# Patient Record
Sex: Male | Born: 1965 | Race: White | Hispanic: No | Marital: Single | Smoking: Former smoker
Health system: Southern US, Community
[De-identification: ages and names within clinical notes are randomized; demographics above are authoritative.]

## PROBLEM LIST (undated history)

## (undated) DIAGNOSIS — K5792 Diverticulitis of intestine, part unspecified, without perforation or abscess without bleeding: Secondary | ICD-10-CM

## (undated) DIAGNOSIS — F419 Anxiety disorder, unspecified: Secondary | ICD-10-CM

## (undated) DIAGNOSIS — I10 Essential (primary) hypertension: Secondary | ICD-10-CM

## (undated) DIAGNOSIS — Z905 Acquired absence of kidney: Secondary | ICD-10-CM

## (undated) HISTORY — PX: OTHER SURGICAL HISTORY: SHX169

## (undated) HISTORY — PX: APPENDECTOMY: SHX54

---

## 2002-08-31 ENCOUNTER — Encounter: Payer: Self-pay | Admitting: Family Medicine

## 2002-08-31 ENCOUNTER — Ambulatory Visit (HOSPITAL_COMMUNITY): Admission: RE | Admit: 2002-08-31 | Discharge: 2002-08-31 | Payer: Self-pay | Admitting: Family Medicine

## 2004-08-19 ENCOUNTER — Inpatient Hospital Stay (HOSPITAL_COMMUNITY): Admission: EM | Admit: 2004-08-19 | Discharge: 2004-08-21 | Payer: Self-pay | Admitting: Emergency Medicine

## 2004-08-19 ENCOUNTER — Ambulatory Visit: Payer: Self-pay | Admitting: Internal Medicine

## 2004-08-21 ENCOUNTER — Inpatient Hospital Stay (HOSPITAL_COMMUNITY): Admission: RE | Admit: 2004-08-21 | Discharge: 2004-08-23 | Payer: Self-pay | Admitting: Psychiatry

## 2005-08-04 ENCOUNTER — Emergency Department (HOSPITAL_COMMUNITY): Admission: EM | Admit: 2005-08-04 | Discharge: 2005-08-04 | Payer: Self-pay | Admitting: Emergency Medicine

## 2006-01-23 ENCOUNTER — Emergency Department (HOSPITAL_COMMUNITY): Admission: EM | Admit: 2006-01-23 | Discharge: 2006-01-23 | Payer: Self-pay | Admitting: Emergency Medicine

## 2006-04-11 ENCOUNTER — Emergency Department (HOSPITAL_COMMUNITY): Admission: EM | Admit: 2006-04-11 | Discharge: 2006-04-12 | Payer: Self-pay | Admitting: Emergency Medicine

## 2006-04-14 ENCOUNTER — Inpatient Hospital Stay (HOSPITAL_COMMUNITY): Admission: AD | Admit: 2006-04-14 | Discharge: 2006-04-21 | Payer: Self-pay | Admitting: Family Medicine

## 2006-04-14 ENCOUNTER — Ambulatory Visit (HOSPITAL_COMMUNITY): Admission: RE | Admit: 2006-04-14 | Discharge: 2006-04-14 | Payer: Self-pay | Admitting: Family Medicine

## 2006-06-01 ENCOUNTER — Ambulatory Visit (HOSPITAL_COMMUNITY): Admission: RE | Admit: 2006-06-01 | Discharge: 2006-06-01 | Payer: Self-pay | Admitting: General Surgery

## 2006-06-08 ENCOUNTER — Ambulatory Visit: Payer: Self-pay | Admitting: Internal Medicine

## 2006-06-08 ENCOUNTER — Ambulatory Visit (HOSPITAL_COMMUNITY): Admission: RE | Admit: 2006-06-08 | Discharge: 2006-06-08 | Payer: Self-pay | Admitting: Internal Medicine

## 2010-02-16 ENCOUNTER — Encounter: Payer: Self-pay | Admitting: Internal Medicine

## 2010-06-11 NOTE — Op Note (Signed)
[name], [name]             ACCOUNT NO.:  [id number]   MEDICAL RECORD NO.:  [id number]          PATIENT TYPE:  AMB   LOCATION:  DAY                           FACILITY:  [facility]   PHYSICIAN:  Lionel December, M.D.    DATE OF BIRTH:  1965/12/08   DATE OF PROCEDURE:  06/08/2006  DATE OF DISCHARGE:                               OPERATIVE REPORT   PROCEDURE:  Attempted colonoscopy, limited to flexible sigmoidoscopy.   INDICATIONS FOR PROCEDURE:  Kenwood is a 45 year old Caucasian male who  was recently hospitalized for diverticulitis and treated with IV  antibiotics.  He was discharged on Cipro and Flagyl but could not  tolerate Flagyl.  He may have taken another antibiotic in addition to  Cipro.  He was seen by Dr. Malvin Johns in follow-up and still having some  discomfort with his bowel movement, although he is not having any fever  or chills.  CT was done and showed persistent thickening in the sigmoid  colon with some air outside suggesting fistula and also raising the  possibility of a neoplastic process.  Dr. Malvin Johns therefore requested  diagnostic colonoscopy.  He did not have any problem with the prep.  Procedure risks were reviewed with the patient and informed consent was  obtained.   MEDICATIONS FOR CONSCIOUS SEDATION:  Demerol 50 mg IV, Versed 11 mg IV  in divided dose.   FINDINGS:  Procedure performed in endoscopy suite.  The patient's vital  signs and O2 sat were monitored during the procedure and remained  stable.  The patient was placed in the left lateral recumbent position  and rectal examination performed.  No abnormality was noted on external  or digital exam.  Pentax videoscope was placed in the rectum and  advanced into distal sigmoid colon where there was mucosal edema,  exudate and erythema.  There was luminal compromise.  I could not find  the lumen because it was right at the bends.  Felt these findings were  consistent with diverticulitis and therefore  endoscope was withdrawn.  Rectal mucosa was normal.  Scope was retroflexed to examine anorectal  junction which was unremarkable except thickening to mucosa of the anal  canal.  Scope was straightened and withdrawn.  The patient tolerated the  procedure well.   FINAL DIAGNOSIS:  Mucosal edema, erythema and exudate with a luminal  compromise in the distal sigmoid colon.  These findings are suspicious  for recurrent or partially resolved diverticulitis.  Colonoscopy,  therefore was not performed.   RECOMMENDATIONS:  1. Low-residue diet.  2. Doxycycline 100 mg p.o. b.i.d. prescription given for 60.  3. He was also given prescription for Vicodin 5/500 one t.i.d. p.r.n.      20 without refill.  4. Should he experience worsening pain, fever, nausea, vomiting,  he      should come to emergency room or give Dr. Malvin Johns or myself a      call.   Goal would be to try to control the disease activity so that he could  have one stage procedure should he need surgical intervention.      [employer]  Karilyn Cota, M.D.  Electronically Signed     NR/MEDQ  D:  06/08/2006  T:  06/08/2006  Job:  [id number]   cc:   Barbaraann Barthel, M.D.  Fax: [phone]   Dr. Phillips Odor
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
## 2010-06-14 NOTE — Discharge Summary (Signed)
NAMEJAKWAN, SALLY             ACCOUNT NO.:  [id number]   MEDICAL RECORD NO.:  [id number]          PATIENT TYPE:  INP   LOCATION:  A331                          FACILITY:  [facility]   PHYSICIAN:  Kirk Ruths, M.D.DATE OF BIRTH:  1965/02/14   DATE OF ADMISSION:  04/14/2006  DATE OF DISCHARGE:  03/25/2008LH                               DISCHARGE SUMMARY   DISCHARGE DIAGNOSES:  1. Acute diverticulitis.  2. Phlegmon in the pelvis.  3. Benign stable nodules in the chest.   HOSPITAL COURSE:  This 44 year old male had been seen in the Emergency  Room several days before admission.  At that time he was diagnosed with  diverticulitis and treated with oral antibiotics.  The patient was seen  in my office with worsening pain, tenderness.  He was admitted for acute  diverticulitis, IV Cipro and Flagyl.  CT scan of the abdomen and pelvis  showed acute inflammation consistent with diverticulitis as well as some  pelvic mass and some pulmonary nodules.  The patient was seen in  consultation by Dr. 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  Recommended no  further work-up or treatment.  The patient was also seen by surgery, Dr.  Malvin Johns who followed the patient closely in the event he should need  acute surgery.  The patient was in considerable pain and febrile for the  first several days.  He actually had a narcosis from Dilaudid but  responded promptly to Narcan.  He was transferred to intensive care at  that time for closer observation and his narcotics were reduced.  Initial white count was 15,000.  Remained elevated throughout his stay.  It was to 16.7 on discharge.  Initial sed rate was 18.  Repeat on this  stay was up to 30.  The patient's liver function tests were normal.  Thyroid studies were normal.  Urinalysis was normal.  The patient began  feeling better by the third or fourth hospital day.  Clear liquids were  advanced.  He was tolerating a regular diet.  He underwent a repeat CT  which showed a possible 1 cm tiny abscess in the phlegmon.  Dr. Malvin Johns  of surgery reviewed this with the radiologist and felt the patient was  defervescing and he should respond to IV antibiotics.  He was discharged  home on Cipro and Flagyl for another week.  He will be followed by Dr.  Malvin Johns for three weeks for follow-up CT.      Kirk Ruths, M.D.  Electronically Signed     WMM/MEDQ  D:  05/20/2006  T:  05/20/2006  Job:  [id number]
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
## 2010-06-14 NOTE — Consult Note (Signed)
[name], [name]             ACCOUNT NO.:  [id number]   MEDICAL RECORD NO.:  [id number]          PATIENT TYPE:  INP   LOCATION:  A311                          FACILITY:  [facility]   PHYSICIAN:  Edward L. Juanetta Gosling, M.D.DATE OF BIRTH:  04-28-1965   DATE OF CONSULTATION:  04/15/2006  DATE OF DISCHARGE:                                 CONSULTATION   REASON FOR CONSULTATION:  Abnormal chest CT   HISTORY:  [name] is a 45 year old who came in with abdominal pain.  He has had cramping abdominal pain in the periumbilical area. He has had  some nausea, no diarrhea, no vomiting.  He was diagnosed with acute  diverticulitis and brought into the hospital.  As part of his  evaluation, he had abdominal CT which shows that he has left subpleural  and left posterolateral chest wall masses that do enhance.  These are  unchanged since 2006, so it is very unlikely that they represent  anything other than a benign etiology.  However, he does say that he has  some shortness of breath occasionally.  He coughs and sometimes coughs  up something. He does smoke about a pack and one-half of cigarettes a  day.  He has a history of allergies and asthma, and he does have a  family history of lung problem in his grandfather.  He does not have any  hypertension, diabetes or any other medical problems.   His only medication is Lexapro.   REVIEW OF SYSTEMS:  Other than as mentioned, is pretty much negative.  He says he is getting better from his abdominal discomfort.   PHYSICAL EXAMINATION:  GENERAL:  He is awake and alert.  VITAL SIGNS: His temperature is 97.4, pulse 89, respirations 20, blood  pressure 114/60, O2 saturation 94% on room air.  HEENT: His pupils are reactive.  His nose and throat are clear.  NECK:  Supple without masses.  CHEST: Clear without wheezes, rales or rhonchi.  HEART: Regular without murmur, gallop or rub.  ABDOMEN:  Fairly soft.   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  I do not think  he needs any more evaluation.  This has been stable for almost 2 years.  Thanks for allowing me to see him with you.   Sincerely,      Edward L. Juanetta Gosling, M.D.  Electronically Signed     ELH/MEDQ  D:  04/15/2006  T:  04/15/2006  Job:  [id number]

## 2017-01-27 HISTORY — PX: COLONOSCOPY: SHX174

## 2020-02-17 ENCOUNTER — Emergency Department (HOSPITAL_COMMUNITY): Payer: Worker's Compensation

## 2020-02-17 ENCOUNTER — Encounter (HOSPITAL_COMMUNITY): Payer: Self-pay | Admitting: Emergency Medicine

## 2020-02-17 ENCOUNTER — Emergency Department (HOSPITAL_COMMUNITY)
Admission: EM | Admit: 2020-02-17 | Discharge: 2020-02-18 | Disposition: A | Discharge status: Home / Self Care | Payer: Worker's Compensation | Attending: Emergency Medicine | Admitting: Emergency Medicine

## 2020-02-17 ENCOUNTER — Other Ambulatory Visit: Payer: Self-pay

## 2020-02-17 DIAGNOSIS — S27321A Contusion of lung, unilateral, initial encounter: Secondary | ICD-10-CM | POA: Diagnosis not present

## 2020-02-17 DIAGNOSIS — S299XXA Unspecified injury of thorax, initial encounter: Secondary | ICD-10-CM | POA: Diagnosis present

## 2020-02-17 DIAGNOSIS — S2232XA Fracture of one rib, left side, initial encounter for closed fracture: Secondary | ICD-10-CM | POA: Insufficient documentation

## 2020-02-17 DIAGNOSIS — S301XXA Contusion of abdominal wall, initial encounter: Secondary | ICD-10-CM | POA: Insufficient documentation

## 2020-02-17 DIAGNOSIS — W000XXA Fall on same level due to ice and snow, initial encounter: Secondary | ICD-10-CM | POA: Diagnosis not present

## 2020-02-17 DIAGNOSIS — S32009A Unspecified fracture of unspecified lumbar vertebra, initial encounter for closed fracture: Secondary | ICD-10-CM | POA: Diagnosis not present

## 2020-02-17 DIAGNOSIS — F17210 Nicotine dependence, cigarettes, uncomplicated: Secondary | ICD-10-CM | POA: Diagnosis not present

## 2020-02-17 HISTORY — DX: Acquired absence of kidney: Z90.5

## 2020-02-17 HISTORY — DX: Diverticulitis of intestine, part unspecified, without perforation or abscess without bleeding: K57.92

## 2020-02-17 LAB — CBC WITH DIFFERENTIAL/PLATELET
Abs Immature Granulocytes: 0.06 10*3/uL (ref 0.00–0.07)
Basophils Absolute: 0.1 10*3/uL (ref 0.0–0.1)
Basophils Relative: 1 %
Eosinophils Absolute: 0.1 10*3/uL (ref 0.0–0.5)
Eosinophils Relative: 0 %
HCT: 46.9 % (ref 39.0–52.0)
Hemoglobin: 16.1 g/dL (ref 13.0–17.0)
Immature Granulocytes: 0 %
Lymphocytes Relative: 16 %
Lymphs Abs: 2.6 10*3/uL (ref 0.7–4.0)
MCH: 34.5 pg — ABNORMAL HIGH (ref 26.0–34.0)
MCHC: 34.3 g/dL (ref 30.0–36.0)
MCV: 100.4 fL — ABNORMAL HIGH (ref 80.0–100.0)
Monocytes Absolute: 1.3 10*3/uL — ABNORMAL HIGH (ref 0.1–1.0)
Monocytes Relative: 8 %
Neutro Abs: 12.3 10*3/uL — ABNORMAL HIGH (ref 1.7–7.7)
Neutrophils Relative %: 75 %
Platelets: 226 10*3/uL (ref 150–400)
RBC: 4.67 MIL/uL (ref 4.22–5.81)
RDW: 13.1 % (ref 11.5–15.5)
WBC: 16.4 10*3/uL — ABNORMAL HIGH (ref 4.0–10.5)
nRBC: 0 % (ref 0.0–0.2)

## 2020-02-17 LAB — BASIC METABOLIC PANEL
Anion gap: 6 (ref 5–15)
BUN: 12 mg/dL (ref 6–20)
CO2: 27 mmol/L (ref 22–32)
Calcium: 9 mg/dL (ref 8.9–10.3)
Chloride: 102 mmol/L (ref 98–111)
Creatinine, Ser: 1.01 mg/dL (ref 0.61–1.24)
GFR, Estimated: >60 mL/min (ref >60)
Glucose, Bld: 108 mg/dL — ABNORMAL HIGH (ref 70–99)
Potassium: 3.7 mmol/L (ref 3.5–5.1)
Sodium: 135 mmol/L (ref 135–145)

## 2020-02-17 MED ORDER — ONDANSETRON HCL 4 MG/2ML IJ SOLN
4.0000 mg | Freq: Once | INTRAMUSCULAR | Status: AC
  Administered 2020-02-17: 4 mg via INTRAVENOUS
  Filled 2020-02-17: qty 2

## 2020-02-17 MED ORDER — HYDROMORPHONE HCL 1 MG/ML IJ SOLN
1.0000 mg | Freq: Once | INTRAMUSCULAR | Status: AC
  Administered 2020-02-18: 1 mg via INTRAVENOUS
  Filled 2020-02-17: qty 1

## 2020-02-17 MED ORDER — LIDOCAINE 5 % EX PTCH
1.0000 | MEDICATED_PATCH | CUTANEOUS | Status: DC
  Administered 2020-02-18: 1 via TRANSDERMAL
  Filled 2020-02-17: qty 1

## 2020-02-17 MED ORDER — IOHEXOL 300 MG/ML  SOLN
75.0000 mL | Freq: Once | INTRAMUSCULAR | Status: AC | PRN
  Administered 2020-02-17: 75 mL via INTRAVENOUS

## 2020-02-17 MED ORDER — HYDROMORPHONE HCL 1 MG/ML IJ SOLN
1.0000 mg | Freq: Once | INTRAMUSCULAR | Status: AC
Start: 2020-02-17 — End: 2020-02-17
  Administered 2020-02-17: 1 mg via INTRAVENOUS
  Filled 2020-02-17: qty 1

## 2020-02-17 MED ORDER — KETOROLAC TROMETHAMINE 15 MG/ML IJ SOLN
15.0000 mg | Freq: Once | INTRAMUSCULAR | Status: AC
  Administered 2020-02-18: 15 mg via INTRAVENOUS
  Filled 2020-02-17: qty 1

## 2020-02-17 MED ORDER — OXYCODONE-ACETAMINOPHEN 5-325 MG PO TABS
2.0000 | ORAL_TABLET | Freq: Once | ORAL | Status: AC
  Administered 2020-02-17: 2 via ORAL
  Filled 2020-02-17: qty 2

## 2020-02-17 NOTE — ED Triage Notes (Signed)
Pt c/o back pain after a fall when he slipped on some ice and his mid back hit on the outside corner of the steps

## 2020-02-17 NOTE — ED Notes (Signed)
Patient transported to X-ray 

## 2020-02-17 NOTE — ED Provider Notes (Signed)
[facility] EMERGENCY DEPARTMENT Provider Note   CSN: [id number] Arrival date & time: 02/17/20  1837     History Chief Complaint  Patient presents with  . Back Pain    [name] is a 55 y.o. male who presents with cc of back injury.  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  He slipped backward and hit his left back against the corner of concrete stairs.  He had immediate severe pain, shortness of breath and felt something pop in his lower back.  He has been unable to control his pain since that time despite taking over-the-counter medications.  He has pain with any movement, even shallow breathing is severe.  He rates the pain at 10 out of 10.  He does have some numbness and tingling Gay the left leg which is new.  He did not hit his head or lose consciousness.  He has large bruising over the left flank area he does not take any blood HPI     Past Medical History:  Diagnosis Date  . Diverticulitis   . H/O left nephrectomy     There are no problems to display for this patient.   Past Surgical History:  Procedure Laterality Date  . sigmoid removal         History reviewed. No pertinent family history.  Social History   Tobacco Use  . Smoking status: Current Every Day Smoker    Packs/day: 0.50    Types: Cigarettes  . Smokeless tobacco: Never Used  Vaping Use  . Vaping Use: Never used  Substance Use Topics  . Alcohol use: Yes    Alcohol/week: 1.0 standard drink    Types: 1 Cans of beer per week    Home Medications Prior to Admission medications   Not on File    Allergies    Penicillins  Review of Systems   Review of Systems Ten systems reviewed and are negative for acute change, except as noted in the HPI.   Physical Exam Updated Vital Signs BP (!) 147/95   Pulse 90   Temp 97.8 F (36.6 C) (Oral)   Resp 16   Ht 6\' 2"  (1.88 m)   Wt 93 kg   SpO2 99%   BMI 26.32 kg/m   Physical Exam Vitals and nursing  note reviewed.  Constitutional:      General: He is not in acute distress.    Appearance: He is well-developed and well-nourished. He is not diaphoretic.  HENT:     Head: Normocephalic and atraumatic.  Eyes:     General: No scleral icterus.    Conjunctiva/sclera: Conjunctivae normal.  Cardiovascular:     Rate and Rhythm: Normal rate and regular rhythm.     Heart sounds: Normal heart sounds.  Pulmonary:     Effort: Pulmonary effort is normal. No respiratory distress.     Breath sounds: Normal breath sounds.  Abdominal:     Palpations: Abdomen is soft.     Tenderness: There is no abdominal tenderness.  Musculoskeletal:        General: No edema.     Cervical back: Normal range of motion and neck supple.     Comments: Bruising noted over the left flank.  There is crepitus over the left lateral lower rib cage present with breathing.  No midline spinal tenderness.  Range of motion of the lumbar spine is limited due to pain.  Skin:    General: Skin is  warm and dry.  Neurological:     Mental Status: He is alert.  Psychiatric:        Behavior: Behavior normal.     ED Results / Procedures / Treatments   Labs (all labs ordered are listed, but only abnormal results are displayed) Labs Reviewed  CBC WITH DIFFERENTIAL/PLATELET - Abnormal; Notable for the following components:      Result Value   WBC 16.4 (*)    MCV 100.4 (*)    MCH 34.5 (*)    Neutro Abs 12.3 (*)    Monocytes Absolute 1.3 (*)    All other components within normal limits  BASIC METABOLIC PANEL  I-STAT CHEM 8, ED    EKG None  Radiology DG Thoracic Spine 2 View  Result Date: 02/17/2020 CLINICAL DATA:  Fall EXAM: THORACIC SPINE 2 VIEWS COMPARISON:  None. FINDINGS: Mild scoliosis. Sagittal alignment within normal limits. Vertebral body heights are maintained. There are mild degenerative osteophytes IMPRESSION: Mild scoliosis and degenerative change. No acute osseous abnormality. Electronically Signed   By: Jasmine Pang M.D.   On: 02/17/2020 21:41   DG Lumbar Spine Complete  Result Date: 02/17/2020 CLINICAL DATA:  Fall EXAM: LUMBAR SPINE - COMPLETE 4+ VIEW COMPARISON:  None. FINDINGS: Five non rib-bearing lumbar type vertebra. Multiple old appearing right lower rib fractures. Vertebral body heights are maintained. Mild degenerative changes at L2-L3. IMPRESSION: No acute osseous abnormality. Electronically Signed   By: Jasmine Pang M.D.   On: 02/17/2020 21:42    Procedures Procedures (including critical care time)  Medications Ordered in ED Medications  oxyCODONE-acetaminophen (PERCOCET/ROXICET) 5-325 MG per tablet 2 tablet (2 tablets Oral Given 02/17/20 2136)  HYDROmorphone (DILAUDID) injection 1 mg (1 mg Intravenous Given 02/17/20 2216)  ondansetron (ZOFRAN) injection 4 mg (4 mg Intravenous Given 02/17/20 2215)    ED Course  I have reviewed the triage vital signs and the nursing notes.  Pertinent labs & imaging results that were available during my care of the patient were reviewed by me and considered in my medical decision making (see chart for details).    MDM Rules/Calculators/A&P                          DC:VUDTHY/ back and flank injury VS: BP (!) 145/80   Pulse 72   Temp 97.8 F (36.6 C) (Oral)   Resp 18   Ht 6\' 2"  (1.88 m)   Wt 93 kg   SpO2 96%   BMI 26.32 kg/m  is gathered by patient and emr. Previous records obtained and reviewed. DDX:The patient's complaint of trauma involves an extensive number of diagnostic and treatment options, and is a complaint that carries with it a high risk of complications, morbidity, and potential mortality. Given the large differential diagnosis, medical decision making is of high complexity. The emergent differential diagnosis for trauma is extensive and requires complex medical decision making. The differential includes, but is not limited to traumatic brain injury, Orbital trauma, maxillofacial trauma, skull fracture,  blunt/penetrating neck trauma, vertebral artery dissection, whiplash, cervical fracture, neurogenic shock, spinal cord injury, thoracic trauma (blunt/penetrating) cardiac trauma, thoracic and lumbar spine trauma. Abdominal trauma (blunt. Penetrating), genitourinary trauma, extremity fractures, skin lacerations/ abrasions, vascular injuries. Labs: I ordered reviewed and interpreted labs which include CBC which shows white blood cell count of 16,000 likely acute phase reaction.  BMP with mildly elevated blood glucose of 108 also likely due to acute phase reaction Imaging: I ordered  and reviewed images which included thoracic and lumbar plain films. I independently visualized and interpreted all imaging.There are no acute, significant findings on today's images. EKG: Consults: MDM: Patient here after traumatic injury.  On examination it feels as though he has rib fractures due to step-off and bony crepitus.  I have ordered CT scan of the chest abdomen and pelvis to rule out any internal injury, missed fracture on plain film as the patient clinically appears fractured and is in severe pain.  I have given signout to Dr. Kennis Carina who will assume care of the patient.        Final Clinical Impression(s) / ED Diagnoses Final diagnoses:  None    Rx / DC Orders ED Discharge Orders    None       Arthor Captain, PA-C 02/18/20 1406    Pollyann Savoy, MD 02/18/20 [phone]

## 2020-02-18 ENCOUNTER — Telehealth (HOSPITAL_COMMUNITY): Payer: Self-pay | Admitting: Emergency Medicine

## 2020-02-18 MED ORDER — OXYCODONE HCL 5 MG PO TABS: 5.0000 mg | ORAL_TABLET | ORAL | 0 refills | Status: DC | PRN

## 2020-02-18 MED ORDER — OXYCODONE HCL 5 MG PO TABS: 2.5000 mg | ORAL_TABLET | Freq: Four times a day (QID) | ORAL | 0 refills | Status: DC | PRN

## 2020-02-18 MED ORDER — NAPROXEN 375 MG PO TABS: 375.0000 mg | ORAL_TABLET | Freq: Two times a day (BID) | ORAL | 0 refills | Status: DC

## 2020-02-18 MED ORDER — HYDROMORPHONE HCL 1 MG/ML IJ SOLN
0.5000 mg | Freq: Once | INTRAMUSCULAR | Status: AC
  Administered 2020-02-18: 0.5 mg via INTRAVENOUS
  Filled 2020-02-18: qty 1

## 2020-02-18 MED ORDER — CYCLOBENZAPRINE HCL 10 MG PO TABS: 5.0000 mg | ORAL_TABLET | Freq: Two times a day (BID) | ORAL | 0 refills | Status: DC | PRN

## 2020-02-18 MED ORDER — LIDOCAINE 5 % EX PTCH: 1.0000 | MEDICATED_PATCH | CUTANEOUS | 0 refills | Status: DC

## 2020-02-18 MED ORDER — HYDROCODONE-ACETAMINOPHEN 5-325 MG PO TABS: 1.0000 | ORAL_TABLET | ORAL | 0 refills | Status: DC | PRN

## 2020-02-18 MED ORDER — OXYCODONE-ACETAMINOPHEN 5-325 MG PO TABS: 1.0000 | ORAL_TABLET | ORAL | 0 refills | Status: DC | PRN

## 2020-02-18 NOTE — Discharge Instructions (Addendum)
You were evaluated in the Emergency Department and after careful evaluation, we did not find any emergent condition requiring admission or further testing in the hospital.  Your pain seems to be due to a broken rib and some small broken bones in your spine.  You also have a mildly bruised part of your lung.  These conditions are painful but are not emergencies requiring procedures.  You will need pain medicine and you will need to take deep breaths throughout the day to help prevent pneumonia.  Please take the medications prescribed as directed.  We also recommend ibuprofen 600 mg every 4-6 hours  Please return to the Emergency Department if you experience any worsening of your condition.  Thank you for allowing [place] to be a part of your care.

## 2020-02-18 NOTE — Telephone Encounter (Signed)
Patient allergic to Hydrocodone. rx switched to oxycodone. Also given naproxen and flexeril

## 2020-02-18 NOTE — ED Provider Notes (Signed)
  Provider Note MRN:  [id number]  Arrival date & time: 02/18/20    ED Course and Medical Decision Making  Assumed care from PA Harris at shift change.  Fell on the snow onto concrete stairs landing on his back, has sustained a rib fracture, pulmonary contusion, TP fractures of the L-spine.  Still in considerable pain, will attempt further pain control and discharge but may need to be admitted.   Pain is adequately controlled, will discharge on pain regimen.  Procedures  Final Clinical Impressions(s) / ED Diagnoses     ICD-10-CM   1. Closed fracture of one rib of left side, initial encounter  S22.32XA   2. Lumbar transverse process fracture, closed, initial encounter (HCC)  S32.009A   3. Contusion of left lung, initial encounter  S27.321A     ED Discharge Orders         Ordered    oxyCODONE (ROXICODONE) 5 MG immediate release tablet  Every 4 hours PRN,   Status:  Discontinued        02/18/20 0113    oxyCODONE (ROXICODONE) 5 MG immediate release tablet  Every 4 hours PRN        02/18/20 0114    HYDROcodone-acetaminophen (NORCO/VICODIN) 5-325 MG tablet  Every 4 hours PRN        02/18/20 0122    lidocaine (LIDODERM) 5 %  Every 24 hours        02/18/20 0122            Discharge Instructions     You were evaluated in the Emergency Department and after careful evaluation, we did not find any emergent condition requiring admission or further testing in the hospital.  Your pain seems to be due to a broken rib and some small broken bones in your spine.  You also have a mildly bruised part of your lung.  These conditions are painful but are not emergencies requiring procedures.  You will need pain medicine and you will need to take deep breaths throughout the day to help prevent pneumonia.  Please take the medications prescribed as directed.  We also recommend ibuprofen 600 mg every 4-6 hours  Please return to the Emergency Department if you experience any worsening of your condition.   Thank you for allowing [place] to be a part of your care.      Elmer Sow. Pilar Plate, MD [facility] [email or web address]    Sabas Sous, MD 02/18/20 [phone]

## 2020-02-20 MED FILL — Oxycodone w/ Acetaminophen Tab 5-325 MG: ORAL | Qty: 6 | Status: AC

## 2020-03-21 ENCOUNTER — Telehealth: Payer: Self-pay | Admitting: Orthopaedic Surgery

## 2020-03-21 NOTE — Telephone Encounter (Signed)
Patient called for appointment.  He said he had fractures of his back/discs.  I told him that our doctors really didn't treat discs.  He said he was still in a lot of pain .  He said he might go back to Urgent Care to be seen and see where they could refer him to.

## 2021-12-03 ENCOUNTER — Ambulatory Visit (HOSPITAL_COMMUNITY)
Admission: EM | Admit: 2021-12-03 | Discharge: 2021-12-03 | Disposition: A | Discharge status: Home / Self Care | Payer: No Payment, Other | Attending: Registered Nurse | Admitting: Registered Nurse

## 2021-12-03 ENCOUNTER — Encounter (HOSPITAL_COMMUNITY): Payer: Self-pay | Admitting: Registered Nurse

## 2021-12-03 DIAGNOSIS — F4323 Adjustment disorder with mixed anxiety and depressed mood: Secondary | ICD-10-CM | POA: Insufficient documentation

## 2021-12-03 NOTE — Discharge Instructions (Addendum)
Mental Health in [facility] [phone] [address] Tressia Miners [place], [place] [address] Specialize in children, adolescents and teens. Provide the following services: Community Support Level 2 & 3 Residential Treatment for males and females Day Treatment Intensive In-Home Services Outpatient Individual & Family Therapy Parenting Classes Diagnostic Assessments  *Also listed at [address], [place], [place] [phone]  [facility], [place] [phone] [address] [place], [place] [address]  [facility] [phone] [address] Estelle June, PhD [place], [place] [address] Services: Parenting Classes Individual, Marriage & Family Counseling Survivors of Sex Abuse Support Family Violence Education  [name] [phone] [place], [place] [address] Services: Community Support Outpatient Therapy - individual, family, group Psychological Testing In-Home Services SA Assessments Alcohol and Drug Classes & Treatment Mobile Crisis Advanced access/walk-in clinic Prevention Services  [facility] ([place]) 24/7 Access Line: [phone]     [facility] Outpatient psychiatric Services  New Patient Assessment and Therapy Walk-in Monday thru Thursday 8:00 am first come first serve until slots are full Every Friday from 1:00 pm to 4:00 pm first come first serve until slots are full  New Patient Psychiatric Medication Management Monday thru Friday from 8:00 am to 11:00 am first come first served until slots are full  For all walk-ins we ask that you arrive by 7:15 am because patients will be seen in there order of arrival.   Availability is limited, and therefore you may not be seen on the same day that you walk in.  Our goal is to serve and meet the needs of our community to the best of our ability.     Crisis Mobile: Therapeutic  Alternatives:  [phone] (for crisis response 24 hours a day)  [facility] Hotline:      [phone]  Therapeutic Alternatives: Mobile Crisis Management 24 hours:  [phone]  Outpatient Psychiatry and Counseling  [facility] [place] sliding scale fee and walk in schedule: M-F 8am-12pm/1pm-3pm [address]  [place], [place] [address] [phone]  [facility] [address] [place], [place] [address] [phone]  [facility] ([facility] [employer])- new patient walk-in appointments available Monday - Friday 8am -3pm.          [address] [place], [place] [address] [phone] or crisis line- [phone]  [facility] Outpatient Services/ Intensive Outpatient Therapy Program [address] [place], [place] [address] [phone]  [facility] Mental [facility]                  Crisis Services      [phone] [address]     [place], [place] [address]                 [facility]   [facility] [phone]. [address] [place], [place] [address]   [facility]          [address] Bea Laura  [place], [place] [address]       [phone]  [facility] [address] [place], [place] [address] [phone]  [facility]    [address]    [place], [place] [address]     [phone]       Andee Poles, MD     [address]  [place] [address]     [place] [address]  [facility]     [address], [place], [place] [address] [place], [place] [address] [place]     [address]     [place], [place]  [address]     [phone]       [facility] [address] [place], [place] [address] [phone] Resources for Temporary Residential Assistance/Crisis Centers

## 2021-12-03 NOTE — ED Notes (Signed)
Patient discharged by Shuvon Rankins, NP with written and verbal instructions.  
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
## 2021-12-03 NOTE — BH Assessment (Signed)
Mental Health in [place]  [facility] [phone] [address] Dawn Johnson, LCSW [place], [place] [address] Specialize in children, adolescents and teens. Provide the following services: Community Support Level 2 & 3 Residential Treatment for males and females Day Treatment Intensive In-Home Services Outpatient Individual & Family Therapy Parenting Classes Diagnostic Assessments  *Also listed at [address], [place], [place] [phone]  [facility] [phone] [address] [place], [place] [address]  [facility] [phone] [address] John Grogan, PhD [place], [place] [address] Services: Parenting Classes Individual, Marriage & Family Counseling Survivors of Sex Abuse Support Family Violence Education  [facility] [phone] [address] [place], [place] [address] Services: Community Support Outpatient Therapy - individual, family, group Psychological Testing In-Home Services SA Assessments Alcohol and Drug Classes & Treatment Mobile Crisis Advanced access/walk-in clinic Prevention Services  [facility] ([place]) 24/7 Access Line: [phone]  

## 2021-12-03 NOTE — Progress Notes (Signed)
   12/03/21 1628  [place] Triage Screening (Walk-ins at [facility] only)  How Did You Hear About [place]? Self  What Is the Reason for Your Visit/Call Today? Pt is a 56 yo male who presented voluntarily and unaccompanied due to worsening depression. Pt denied SI, HI, NSSH, AVH, paranoia and any substance use. Pt stated that his girlfriend thinks he has Bipolar as his sister did and she wanted him to get an assessment. Pt does not seem manic at present. Pt stated his GF is causing problems in their relationship and within his father with his niece, sister and 33 yo mother. Pt did stated that he has tried to kill himself twice in the past with the last attempt an intentional overdose of prescription medication in 2006 or 2007. Pt stated that he was psychiatrically hospitalized at that time. Pt stated he is looking for resources for OP therapy.  How Long Has This Been Causing You Problems? > than 6 months  Have You Recently Had Any Thoughts About Hurting Yourself? No  Are You Planning to Commit Suicide/Harm Yourself At This time? No  Have you Recently Had Thoughts About [place]? No  Are You Planning To Harm Someone At This Time? No  Are you currently experiencing any auditory, visual or other hallucinations? No  Have You Used Any Alcohol or Drugs in the Past 24 Hours? No  Do you have any current medical co-morbidities that require immediate attention? No  Clinician description of patient physical appearance/behavior: Pt was calm, cooperative, alert and appeared oriented. Pt did not appear to be responding to internal stimuli, experiencing delusional thinking or intoxicated. Pt's speech and movement appeared within normal limits and appearance was unremarkable. Pt's mood seemed anxious and somewhat depressed, and pt had a flat affect and at times a range of emotion which was congruent. Pt's speech and movement seemed nervous and jittery. Pt's judgment and insight seemed fair to good.  What Do You Feel Would  Help You the Most Today? Treatment for Depression or other mood problem  If access to [facility] was not available, would you have sought care in the Emergency Department? Yes  Determination of Need Routine (7 days)  Options For Referral Medication Management;Outpatient Therapy   Jerelene Salaam T. Mare Ferrari, [address], [facility], [facility] Triage Specialist [facility]

## 2021-12-10 ENCOUNTER — Encounter (HOSPITAL_COMMUNITY): Payer: Self-pay

## 2021-12-10 ENCOUNTER — Emergency Department (HOSPITAL_COMMUNITY)
Admission: EM | Admit: 2021-12-10 | Discharge: 2021-12-10 | Disposition: A | Discharge status: Home / Self Care | Payer: Self-pay | Attending: Emergency Medicine | Admitting: Emergency Medicine

## 2021-12-10 DIAGNOSIS — F419 Anxiety disorder, unspecified: Secondary | ICD-10-CM | POA: Insufficient documentation

## 2021-12-10 DIAGNOSIS — H00014 Hordeolum externum left upper eyelid: Secondary | ICD-10-CM | POA: Insufficient documentation

## 2021-12-10 MED ORDER — POLYMYXIN B-TRIMETHOPRIM 10000-0.1 UNIT/ML-% OP SOLN: 1.0000 [drp] | OPHTHALMIC | 0 refills | Status: DC

## 2021-12-10 MED ORDER — HYDROXYZINE HCL 25 MG PO TABS: 25.0000 mg | ORAL_TABLET | Freq: Four times a day (QID) | ORAL | 0 refills | Status: DC

## 2021-12-10 NOTE — Discharge Instructions (Addendum)
It was a pleasure taking care of you today.  As discussed, you have a stye which is the growth on your upper eyelid.  Continue to use warm compresses.  I am sending you home with antibiotic drops.  Use every 4 hours.  I have included the number of the eye doctor.  Call to schedule an appointment for further evaluation.  I am Also sending you home with anxiety medication.  Take as needed for anxiety.  I have included resources in the community. Return to the ER for new or worsening symptoms.

## 2021-12-10 NOTE — ED Provider Notes (Signed)
[facility]-EMERGENCY DEPT Provider Note   CSN: [id number] Arrival date & time: 12/10/21  6546     History  Chief Complaint  Patient presents with   Eye Problem    DRAYTON TIEU is a 56 y.o. male with a past medical history significant for adjustment disorder who presents to the ED due to left eye pain and edema x2 weeks.  Patient states he has a lump on his upper eyelid that has been present for the past 2 weeks.  Admits to vision changes because he feels the growth is in his visual field.  No fever or chills.  No pain with EOMs.  Patient also admits to some anxiety.  Patient states his partner has some mental health issues which has been causing him increased stress.  Patient seen at behavioral health urgent care on 11/7 where he was psychiatric cleared.  Patient denies SI and HI.  Denies auditory/visual hallucinations.  History obtained from patient and past medical records. No interpreter used during encounter.       Home Medications Prior to Admission medications   Medication Sig Start Date End Date Taking? Authorizing Provider  cyclobenzaprine (FLEXERIL) 10 MG tablet Take 0.5-1 tablets (5-10 mg total) by mouth 2 (two) times daily as needed for muscle spasms. 02/18/20   Arthor Captain, PA-C  HYDROcodone-acetaminophen (NORCO/VICODIN) 5-325 MG tablet Take 1 tablet by mouth every 4 (four) hours as needed. 02/18/20   Sabas Sous, MD  hydrOXYzine (ATARAX) 25 MG tablet Take 1 tablet (25 mg total) by mouth every 6 (six) hours. 12/10/21   Long, Arlyss Repress, MD  lidocaine (LIDODERM) 5 % Place 1 patch onto the skin daily. Remove & Discard patch within 12 hours or as directed by MD 02/18/20   Sabas Sous, MD  naproxen (NAPROSYN) 375 MG tablet Take 1 tablet (375 mg total) by mouth 2 (two) times daily with a meal. 02/18/20   Arthor Captain, PA-C  oxyCODONE (ROXICODONE) 5 MG immediate release tablet Take 0.5-1 tablets (2.5-5 mg total) by mouth every 6 (six) hours as  needed for severe pain. 02/18/20   Arthor Captain, PA-C  oxyCODONE-acetaminophen (PERCOCET/ROXICET) 5-325 MG tablet Take 1 tablet by mouth every 4 (four) hours as needed for severe pain. 02/18/20   Sabas Sous, MD  trimethoprim-polymyxin b (POLYTRIM) ophthalmic solution Place 1 drop into the left eye every 4 (four) hours. 12/10/21   Long, Arlyss Repress, MD      Allergies    Penicillins    Review of Systems   Review of Systems  Constitutional:  Negative for fever.  Eyes:  Positive for pain, redness and visual disturbance.  Psychiatric/Behavioral:  Negative for hallucinations and suicidal ideas. The patient is nervous/anxious.   All other systems reviewed and are negative.   Physical Exam Updated Vital Signs BP (!) 149/89   Pulse 65   Temp 98.4 F (36.9 C) (Oral)   Resp 18   SpO2 100%  Physical Exam Vitals and nursing note reviewed.  Constitutional:      General: He is not in acute distress.    Appearance: He is not ill-appearing.  HENT:     Head: Normocephalic.  Eyes:     Pupils: Pupils are equal, round, and reactive to light.     Comments: Hordeolum to left upper eyelid. Injected left conjunctiva. EOMS intact.  Cardiovascular:     Rate and Rhythm: Normal rate and regular rhythm.     Pulses: Normal pulses.  Heart sounds: Normal heart sounds. No murmur heard.    No friction rub. No gallop.  Pulmonary:     Effort: Pulmonary effort is normal.     Breath sounds: Normal breath sounds.  Abdominal:     General: Abdomen is flat. There is no distension.     Palpations: Abdomen is soft.     Tenderness: There is no abdominal tenderness. There is no guarding or rebound.  Musculoskeletal:        General: Normal range of motion.     Cervical back: Neck supple.  Skin:    General: Skin is warm and dry.  Neurological:     General: No focal deficit present.     Mental Status: He is alert.  Psychiatric:        Mood and Affect: Mood normal.        Behavior: Behavior normal.      ED Results / Procedures / Treatments   Labs (all labs ordered are listed, but only abnormal results are displayed) Labs Reviewed - No data to display  EKG None  Radiology No results found.  Procedures Procedures    Medications Ordered in ED Medications - No data to display  ED Course/ Medical Decision Making/ A&P                           Medical Decision Making Amount and/or Complexity of Data Reviewed External Data Reviewed: notes.    Details: [facility] note  Risk Prescription drug management.   56 year old male presents to the ED due to left eye pain and edema x2 weeks.  Admits to visual changes given growth is in his visual field.  No fever or chills.  No pain with EOMs.  Patient also admits to increased anxiety due to stress.  Patient seen at behavioral health urgent care on 11/7 where he was psychiatric cleared.  History of adjustment disorder.  Upon arrival, patient afebrile, not tachycardic or hypoxic.  Patient in no acute distress. Hordeolum to left upper eyelid with some injection to left conjunctiva. EOMS intact.  Pupils reactive.  20/20 vision of right eye and 20/30 of left eye.  Suspect symptoms related to hordeolum. Given injection, will discharge with antibiotic drops. Patient denies SI/HI. No hallucinations. Recently cleared by psychiatry on 11/7. No evidence of psychosis. Patient discharged with vistaril as needed for anxiety. Behavioral health resources given to patient at discharge.  Ophthalmology number given to patient at discharge and advised to call if symptoms do not improved.  Warm compresses discussed with patient. Strict ED precautions discussed with patient. Patient states understanding and agrees to plan. Patient discharged home in no acute distress and stable vitals        Final Clinical Impression(s) / ED Diagnoses Final diagnoses:  Hordeolum externum of left upper eyelid  Anxiety    Rx / DC Orders ED Discharge Orders          Ordered     trimethoprim-polymyxin b (POLYTRIM) ophthalmic solution  Every 4 hours,   Status:  Discontinued        12/10/21 1806    hydrOXYzine (ATARAX) 25 MG tablet  Every 6 hours,   Status:  Discontinued        12/10/21 1808    hydrOXYzine (ATARAX) 25 MG tablet  Every 6 hours        12/10/21 2011    trimethoprim-polymyxin b (POLYTRIM) ophthalmic solution  Every 4 hours  12/10/21 2011              Suzy Bouchard, PA-C 12/10/21 2132    Margette Fast, MD 12/13/21 [id number]

## 2021-12-10 NOTE — ED Triage Notes (Signed)
Pt arrived via POV, c/o eye pain and swelling. States he is also having anxiety from social situations. Denies any SI/HI

## 2021-12-12 ENCOUNTER — Ambulatory Visit (HOSPITAL_COMMUNITY)
Admission: AD | Admit: 2021-12-12 | Discharge: 2021-12-12 | Disposition: A | Discharge status: Home / Self Care | Payer: Commercial Managed Care - PPO | Attending: Psychiatry | Admitting: Psychiatry
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
## 2022-03-27 ENCOUNTER — Encounter: Payer: Self-pay | Admitting: Radiology

## 2022-04-18 ENCOUNTER — Encounter: Payer: Self-pay | Admitting: *Deleted

## 2022-07-22 ENCOUNTER — Encounter (INDEPENDENT_AMBULATORY_CARE_PROVIDER_SITE_OTHER): Payer: Self-pay | Admitting: *Deleted

## 2022-08-25 NOTE — Progress Notes (Signed)
[facility] [address], [place] [address]   Clinic Day:  08/26/2022  Referring physician: Kirstie Peri, MD  Patient Care Team: Kirstie Peri, MD as PCP - General (Internal Medicine)   ASSESSMENT & PLAN:   Assessment: ***  Plan: ***  No orders of the defined types were placed in this encounter.     Alben Deeds Teague,acting as a Neurosurgeon for Doreatha Massed, MD.,have documented all relevant documentation on the behalf of Doreatha Massed, MD,as directed by  Doreatha Massed, MD while in the presence of Doreatha Massed, MD.   ***  Daphne R Teague   7/29/202412:30 PM  CHIEF COMPLAINT/PURPOSE OF CONSULT:   Diagnosis: leukocytosis   Current Therapy:  ***  HISTORY OF PRESENT ILLNESS:   [name] is a 57 y.o. male presenting to clinic today for evaluation of leukocytosis at the request of Kirstie Peri, MD.  Today, he states that he is doing well overall. His appetite level is at ***%. His energy level is at ***%.  He was found to have abnormal CBC from 08/15/22 that found elevated WBC at 13.4, elevated MCV at 98, elevated MCH at 34.0, elevated absolute neutrophils at 8.3, elevated absolute lymphs at 3.4, and elevated absolute monocytes 1.1. CBC from 7/11 found elevated WBC at 14.4, elevated MCV at 99, elevated MCH at 33.9, elevated absolute neutrophils at 7.5, elevated absolute lymphs at 4.3, and elevated absolute monocytes 1.7.  ***He denies recent chest pain on exertion, shortness of breath on minimal exertion, pre-syncopal episodes, or palpitations. ***He had not noticed any recent bleeding such as epistaxis, hematuria or hematochezia ***The patient denies over the counter NSAID ingestion. He is not *** on antiplatelets agents. His last colonoscopy was *** ***He had no prior history or diagnosis of cancer. He denies any family history of cancer.  *** His age appropriate screening programs are up-to-date. ***He denies any pica and eats a  variety of diet. ***He never donated blood or received blood transfusion. ***The patient was prescribed oral iron supplements and he takes ***  PAST MEDICAL HISTORY:   Past Medical History: Past Medical History:  Diagnosis Date   Diverticulitis    H/O left nephrectomy     Surgical History: Past Surgical History:  Procedure Laterality Date   sigmoid removal      Social History: Social History   Socioeconomic History   Marital status: Single    Spouse name: Not on file   Number of children: Not on file   Years of education: Not on file   Highest education level: Not on file  Occupational History   Not on file  Tobacco Use   Smoking status: Every Day    Current packs/day: 0.50    Types: Cigarettes   Smokeless tobacco: Never  Vaping Use   Vaping status: Never Used  Substance and Sexual Activity   Alcohol use: Yes    Alcohol/week: 1.0 standard drink of alcohol    Types: 1 Cans of beer per week   Drug use: Not on file   Sexual activity: Not on file  Other Topics Concern   Not on file  Social History Narrative   Not on file   Social Determinants of Health   Financial Resource Strain: Not on file  Food Insecurity: Not on file  Transportation Needs: Not on file  Physical Activity: Not on file  Stress: Not on file  Social Connections: Not on file  Intimate Partner Violence: Not on file    Family  History: No family history on file.  Current Medications:  Current Outpatient Medications:    cyclobenzaprine (FLEXERIL) 10 MG tablet, Take 0.5-1 tablets (5-10 mg total) by mouth 2 (two) times daily as needed for muscle spasms., Disp: 20 tablet, Rfl: 0   HYDROcodone-acetaminophen (NORCO/VICODIN) 5-325 MG tablet, Take 1 tablet by mouth every 4 (four) hours as needed., Disp: 12 tablet, Rfl: 0   hydrOXYzine (ATARAX) 25 MG tablet, Take 1 tablet (25 mg total) by mouth every 6 (six) hours., Disp: 12 tablet, Rfl: 0   lidocaine (LIDODERM) 5 %, Place 1 patch onto the skin  daily. Remove & Discard patch within 12 hours or as directed by MD, Disp: 5 patch, Rfl: 0   naproxen (NAPROSYN) 375 MG tablet, Take 1 tablet (375 mg total) by mouth 2 (two) times daily with a meal., Disp: 20 tablet, Rfl: 0   oxyCODONE (ROXICODONE) 5 MG immediate release tablet, Take 0.5-1 tablets (2.5-5 mg total) by mouth every 6 (six) hours as needed for severe pain., Disp: 20 tablet, Rfl: 0   oxyCODONE-acetaminophen (PERCOCET/ROXICET) 5-325 MG tablet, Take 1 tablet by mouth every 4 (four) hours as needed for severe pain., Disp: 6 tablet, Rfl: 0   trimethoprim-polymyxin b (POLYTRIM) ophthalmic solution, Place 1 drop into the left eye every 4 (four) hours., Disp: 10 mL, Rfl: 0   Allergies: Allergies  Allergen Reactions   Penicillins Rash    REVIEW OF SYSTEMS:   Review of Systems  Constitutional:  Negative for chills, fatigue and fever.  HENT:   Negative for lump/mass, mouth sores, nosebleeds, sore throat and trouble swallowing.   Eyes:  Negative for eye problems.  Respiratory:  Negative for cough and shortness of breath.   Cardiovascular:  Negative for chest pain, leg swelling and palpitations.  Gastrointestinal:  Negative for abdominal pain, constipation, diarrhea, nausea and vomiting.  Genitourinary:  Negative for bladder incontinence, difficulty urinating, dysuria, frequency, hematuria and nocturia.   Musculoskeletal:  Negative for arthralgias, back pain, flank pain, myalgias and neck pain.  Skin:  Negative for itching and rash.  Neurological:  Negative for dizziness, headaches and numbness.  Hematological:  Does not bruise/bleed easily.  Psychiatric/Behavioral:  Negative for depression, sleep disturbance and suicidal ideas. The patient is not nervous/anxious.   All other systems reviewed and are negative.    VITALS:   There were no vitals taken for this visit.  Wt Readings from Last 3 Encounters:  02/17/20 205 lb (93 kg)    There is no height or weight on file to calculate  BMI.   PHYSICAL EXAM:   Physical Exam Vitals and nursing note reviewed. Exam conducted with a chaperone present.  Constitutional:      Appearance: Normal appearance.  Cardiovascular:     Rate and Rhythm: Normal rate and regular rhythm.     Pulses: Normal pulses.     Heart sounds: Normal heart sounds.  Pulmonary:     Effort: Pulmonary effort is normal.     Breath sounds: Normal breath sounds.  Abdominal:     Palpations: Abdomen is soft. There is no hepatomegaly, splenomegaly or mass.     Tenderness: There is no abdominal tenderness.  Musculoskeletal:     Right lower leg: No edema.     Left lower leg: No edema.  Lymphadenopathy:     Cervical: No cervical adenopathy.     Right cervical: No superficial, deep or posterior cervical adenopathy.    Left cervical: No superficial, deep or posterior cervical adenopathy.  Upper Body:     Right upper body: No supraclavicular or axillary adenopathy.     Left upper body: No supraclavicular or axillary adenopathy.  Neurological:     General: No focal deficit present.     Mental Status: He is alert and oriented to person, place, and time.  Psychiatric:        Mood and Affect: Mood normal.        Behavior: Behavior normal.     LABS:      Latest Ref Rng & Units 02/17/2020   10:02 PM  CBC  WBC 4.0 - 10.5 K/uL 16.4   Hemoglobin 13.0 - 17.0 g/dL 57.8   Hematocrit 46.9 - 52.0 % 46.9   Platelets 150 - 400 K/uL 226       Latest Ref Rng & Units 02/17/2020   10:02 PM  CMP  Glucose 70 - 99 mg/dL 629   BUN 6 - 20 mg/dL 12   Creatinine 5.28 - 1.24 mg/dL 4.13   Sodium 244 - 010 mmol/L 135   Potassium 3.5 - 5.1 mmol/L 3.7   Chloride 98 - 111 mmol/L 102   CO2 22 - 32 mmol/L 27   Calcium 8.9 - 10.3 mg/dL 9.0      No results found for: "CEA1", "CEA" / No results found for: "CEA1", "CEA" No results found for: "PSA1" No results found for: "UVO536" No results found for: "CAN125"  No results found for: "TOTALPROTELP", "ALBUMINELP",  "A1GS", "A2GS", "BETS", "BETA2SER", "GAMS", "MSPIKE", "SPEI" No results found for: "TIBC", "FERRITIN", "IRONPCTSAT" No results found for: "LDH"   STUDIES:   No results found.

## 2022-08-26 ENCOUNTER — Inpatient Hospital Stay: Payer: Medicaid Other

## 2022-08-26 ENCOUNTER — Inpatient Hospital Stay: Payer: Medicaid Other | Attending: Hematology | Admitting: Hematology

## 2022-08-26 ENCOUNTER — Encounter: Payer: Self-pay | Admitting: Hematology

## 2022-08-26 VITALS — BP 166/98 | HR 91 | Temp 97.4°F | Resp 18 | Ht 74.0 in | Wt 197.2 lb

## 2022-08-26 DIAGNOSIS — D72829 Elevated white blood cell count, unspecified: Secondary | ICD-10-CM | POA: Insufficient documentation

## 2022-08-26 DIAGNOSIS — Z79899 Other long term (current) drug therapy: Secondary | ICD-10-CM | POA: Insufficient documentation

## 2022-08-26 DIAGNOSIS — Z7951 Long term (current) use of inhaled steroids: Secondary | ICD-10-CM | POA: Diagnosis not present

## 2022-08-26 DIAGNOSIS — F17210 Nicotine dependence, cigarettes, uncomplicated: Secondary | ICD-10-CM | POA: Insufficient documentation

## 2022-08-26 DIAGNOSIS — Z9081 Acquired absence of spleen: Secondary | ICD-10-CM

## 2022-08-26 DIAGNOSIS — D72825 Bandemia: Secondary | ICD-10-CM

## 2022-08-26 DIAGNOSIS — D72820 Lymphocytosis (symptomatic): Secondary | ICD-10-CM | POA: Diagnosis not present

## 2022-08-26 DIAGNOSIS — Z905 Acquired absence of kidney: Secondary | ICD-10-CM | POA: Insufficient documentation

## 2022-08-26 LAB — SEDIMENTATION RATE: Sed Rate: 4 mm/hr (ref 0–16)

## 2022-08-26 LAB — CBC WITH DIFFERENTIAL/PLATELET
Abs Immature Granulocytes: 0.07 10*3/uL (ref 0.00–0.07)
Basophils Absolute: 0.1 10*3/uL (ref 0.0–0.1)
Basophils Relative: 1 %
Eosinophils Absolute: 0.5 10*3/uL (ref 0.0–0.5)
Eosinophils Relative: 4 %
HCT: 48.1 % (ref 39.0–52.0)
Hemoglobin: 16.2 g/dL (ref 13.0–17.0)
Immature Granulocytes: 1 %
Lymphocytes Relative: 33 %
Lymphs Abs: 4.2 10*3/uL — ABNORMAL HIGH (ref 0.7–4.0)
MCH: 33.3 pg (ref 26.0–34.0)
MCHC: 33.7 g/dL (ref 30.0–36.0)
MCV: 99 fL (ref 80.0–100.0)
Monocytes Absolute: 1.3 10*3/uL — ABNORMAL HIGH (ref 0.1–1.0)
Monocytes Relative: 10 %
Neutro Abs: 6.6 10*3/uL (ref 1.7–7.7)
Neutrophils Relative %: 51 %
Platelets: 268 10*3/uL (ref 150–400)
RBC: 4.86 MIL/uL (ref 4.22–5.81)
RDW: 14 % (ref 11.5–15.5)
WBC: 12.8 10*3/uL — ABNORMAL HIGH (ref 4.0–10.5)
nRBC: 0 % (ref 0.0–0.2)

## 2022-08-26 LAB — PROTIME-INR
INR: 1 (ref 0.8–1.2)
Prothrombin Time: 13.7 seconds (ref 11.4–15.2)

## 2022-08-26 LAB — FIBRINOGEN: Fibrinogen: 404 mg/dL (ref 210–475)

## 2022-08-26 LAB — APTT: aPTT: 25 seconds (ref 24–36)

## 2022-08-26 LAB — C-REACTIVE PROTEIN: CRP: 0.9 mg/dL (ref <1.0)

## 2022-08-26 NOTE — Patient Instructions (Signed)
You were seen and examined today by Dr. Ellin Saba. Dr. Ellin Saba is a hematologist, meaning that he specializes in blood abnormalities. Dr. Ellin Saba discussed your past medical history, family history of cancers/blood conditions and the events that led to you being here today.  You were referred to Dr. Ellin Saba due to elevated white blood cell count.  Dr. Ellin Saba has recommended additional labs today for further evaluation.  Follow-up as scheduled.

## 2022-08-27 LAB — SURGICAL PATHOLOGY

## 2022-08-28 ENCOUNTER — Telehealth (INDEPENDENT_AMBULATORY_CARE_PROVIDER_SITE_OTHER): Payer: Self-pay | Admitting: Gastroenterology

## 2022-08-28 NOTE — Telephone Encounter (Signed)
Who is your primary care physician: Hoy Morn  Reasons for the colonoscopy: Recall  Have you had a colonoscopy before?  Yes 2019  Do you have family history of colon cancer? no  Previous colonoscopy with polyps removed? yes  Do you have a history colorectal cancer?   no  Are you diabetic? If yes, Type 1 or Type 2?    no  Do you have a prosthetic or mechanical heart valve? no  Do you have a pacemaker/defibrillator?   no  Have you had endocarditis/atrial fibrillation? no  Have you had joint replacement within the last 12 months?  no  Do you tend to be constipated or have to use laxatives? yes  Do you have any history of drugs or alchohol?  no  Do you use supplemental oxygen?  no  Have you had a stroke or heart attack within the last 6 months?no  Do you take weight loss medication? no  Do you take any blood-thinning medications such as: (aspirin, warfarin, Plavix, Aggrenox)  no  If yes we need the name, milligram, dosage and who is prescribing doctor  Current Outpatient Medications on File Prior to Visit  Medication Sig Dispense Refill   gabapentin (NEURONTIN) 100 MG capsule Take 100 mg by mouth 2 (two) times daily.     gabapentin (NEURONTIN) 300 MG capsule Take 300 mg by mouth 3 (three) times daily.     lisinopril (ZESTRIL) 20 MG tablet Take 20 mg by mouth 2 (two) times daily.     propranolol (INDERAL) 40 MG tablet Take 40 mg by mouth 2 (two) times daily.     QUEtiapine (SEROQUEL) 25 MG tablet Take by mouth.     fluticasone (FLONASE) 50 MCG/ACT nasal spray Place into both nostrils. (Patient not taking: Reported on 08/28/2022)     levocetirizine (XYZAL) 5 MG tablet Take 5 mg by mouth daily. (Patient not taking: Reported on 08/28/2022)     sildenafil (REVATIO) 20 MG tablet Take 20 mg by mouth daily. (Patient not taking: Reported on 08/28/2022)     SYMBICORT 160-4.5 MCG/ACT inhaler Inhale into the lungs. (Patient not taking: Reported on 08/28/2022)     traMADol (ULTRAM)  50 MG tablet Take 50-100 mg by mouth 3 (three) times daily. (Patient not taking: Reported on 08/28/2022)     VENTOLIN HFA 108 (90 Base) MCG/ACT inhaler Inhale into the lungs. (Patient not taking: Reported on 08/28/2022)     No current facility-administered medications on file prior to visit.    Allergies  Allergen Reactions   Penicillins Rash   Hydrocodone Itching     Pharmacy: [facility]  Primary Insurance Name: [employer]  Best number where you can be reached:

## 2022-09-03 ENCOUNTER — Telehealth (INDEPENDENT_AMBULATORY_CARE_PROVIDER_SITE_OTHER): Payer: Self-pay | Admitting: Gastroenterology

## 2022-09-03 NOTE — Telephone Encounter (Signed)
Patient left voice mail message stating he was returning your call to schedule colonoscopy.

## 2022-09-04 MED ORDER — PEG 3350-KCL-NA BICARB-NACL 420 G PO SOLR: 4000.0000 mL | Freq: Once | ORAL | 0 refills | Status: AC

## 2022-09-04 NOTE — Addendum Note (Signed)
Addended by: Marlowe Shores on: 09/04/2022 09:04 AM   Modules accepted: Orders

## 2022-09-04 NOTE — Telephone Encounter (Signed)
Pt left message returning call. Returned call to patient. Pt scheduled for 09/26/22 at 9:15am. Pt will come by office to pick up instructions. Prep sent to pharmacy. PA pending via [employer]

## 2022-09-19 ENCOUNTER — Inpatient Hospital Stay: Payer: Medicaid Other | Attending: Oncology | Admitting: Oncology

## 2022-09-19 VITALS — BP 169/94 | HR 47 | Temp 98.2°F | Resp 18 | Ht 74.0 in | Wt 199.6 lb

## 2022-09-19 DIAGNOSIS — M545 Low back pain, unspecified: Secondary | ICD-10-CM | POA: Diagnosis not present

## 2022-09-19 DIAGNOSIS — R202 Paresthesia of skin: Secondary | ICD-10-CM | POA: Insufficient documentation

## 2022-09-19 DIAGNOSIS — Z9081 Acquired absence of spleen: Secondary | ICD-10-CM | POA: Diagnosis not present

## 2022-09-19 DIAGNOSIS — D72829 Elevated white blood cell count, unspecified: Secondary | ICD-10-CM | POA: Insufficient documentation

## 2022-09-19 DIAGNOSIS — G8929 Other chronic pain: Secondary | ICD-10-CM | POA: Insufficient documentation

## 2022-09-19 DIAGNOSIS — D72820 Lymphocytosis (symptomatic): Secondary | ICD-10-CM

## 2022-09-19 DIAGNOSIS — F17210 Nicotine dependence, cigarettes, uncomplicated: Secondary | ICD-10-CM | POA: Insufficient documentation

## 2022-09-19 DIAGNOSIS — K5900 Constipation, unspecified: Secondary | ICD-10-CM | POA: Diagnosis not present

## 2022-09-19 DIAGNOSIS — R42 Dizziness and giddiness: Secondary | ICD-10-CM | POA: Insufficient documentation

## 2022-09-19 DIAGNOSIS — Z905 Acquired absence of kidney: Secondary | ICD-10-CM | POA: Diagnosis not present

## 2022-09-19 DIAGNOSIS — R200 Anesthesia of skin: Secondary | ICD-10-CM | POA: Insufficient documentation

## 2022-09-19 DIAGNOSIS — R0602 Shortness of breath: Secondary | ICD-10-CM | POA: Diagnosis not present

## 2022-09-19 DIAGNOSIS — R059 Cough, unspecified: Secondary | ICD-10-CM | POA: Insufficient documentation

## 2022-09-19 DIAGNOSIS — F418 Other specified anxiety disorders: Secondary | ICD-10-CM | POA: Diagnosis not present

## 2022-09-19 NOTE — Progress Notes (Signed)
[facility] [address] [place] [address]   Clinic Day:  08/26/2022  Referring physician: Kirstie Peri, MD  Patient Care Team: Kirstie Peri, MD as PCP - General (Internal Medicine)   ASSESSMENT & PLAN:   Assessment:  1.  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 - No fever/weight loss.  Occasional night sweats.  Reports easy bruising. - No aquagenic pruritus/erythromelalgias/prior thrombosis.  2.  Social/family history: - He works as a [employer] fast streamer.  Denies any chemical exposure.  Smokes 3 to 4 cigarettes/day since in his mid 87s. - No family history of malignancy or leukemia.   Plan:   1.  Leukocytosis: -Reviewed lab work from 08/26/2022 with patient and fianc. -Labs from 08/26/2022 show white count of 12.8, absolute lymphocyte count of 4.2 and absolute monocyte count of 1.3.  No evidence of a clotting disorder with normal PT, PTT and INR.  JAK2 with reflex to CALR/MPL and Exon 12-15 were negative.  Flow cytometry without evidence of monoclonal B-cell population. -There is no evidence of underlying leukemia or lymphoma based on labs. -Discussed case with Dr. Ellin Saba who recommends follow-up annually. 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  Discussed in detail smoking cessation. -Recommend follow-up in 1 year with labs and see a provider a few days later.  PLAN SUMMARY: >> Reviewed labs today. >> Recommend follow-up in 1 year with labs a week prior and see provider.     No orders of the defined types were placed in this encounter.  I spent 25 minutes dedicated to the care of this patient (face-to-face and non-face-to-face) on the date of the encounter to include what is described in the assessment and plan.   Mauro Kaufmann, NP   8/23/20249:40 AM  CHIEF COMPLAINT/PURPOSE OF CONSULT:   Diagnosis: leukocytosis   Current Therapy: Surveillance  HISTORY OF  PRESENT ILLNESS:   [name] is a 57 y.o. male presenting to clinic today for follow-up and to review labs for leukocytosis workup.  He presents today with his fiance.  Reports an appetite of 100% energy level 75%.  Has some underlying chronic fatigue, constipation and occasional cough with shortness of breath.  Occasionally will require the use of an inhaler or nebulizer (Symbicort and albuterol inhaler).  Has low back pain that is chronic in nature and rates it a 6 out of 10.  Takes tramadol 50 mg as needed.  Has occasional dizziness and numbness and tingling in fingertips.  Has underlying anxiety and depression but this is managed with medications (fluoxetine and Seroquel.).  Denies any recent infections, surgeries or hospitalizations.  Denies any changes in baseline health since his last visit.  PAST MEDICAL HISTORY:   Past Medical History: Past Medical History:  Diagnosis Date   Diverticulitis    H/O left nephrectomy     Surgical History: Past Surgical History:  Procedure Laterality Date   sigmoid removal      Social History: Social History   Socioeconomic History   Marital status: Single    Spouse name: Not on file   Number of children: Not on file   Years of education: Not on file   Highest education level: Not on file  Occupational History   Not on file  Tobacco Use   Smoking status: Some Days    Current packs/day: 0.50    Types: Cigarettes   Smokeless tobacco: Never  Vaping Use   Vaping status: Never  Used  Substance and Sexual Activity   Alcohol use: Not Currently    Alcohol/week: 1.0 standard drink of alcohol    Types: 1 Cans of beer per week    Comment: occasionally   Drug use: Never   Sexual activity: Not Currently  Other Topics Concern   Not on file  Social History Narrative   Not on file   Social Determinants of Health   Financial Resource Strain: Not on file  Food Insecurity: No Food Insecurity (08/26/2022)   Hunger Vital Sign    Worried About  Running Out of Food in the Last Year: Never true    Ran Out of Food in the Last Year: Never true  Transportation Needs: No Transportation Needs (08/26/2022)   PRAPARE - Administrator, Civil Service (Medical): No    Lack of Transportation (Non-Medical): No  Physical Activity: Not on file  Stress: Not on file  Social Connections: Not on file  Intimate Partner Violence: Not At Risk (08/26/2022)   Humiliation, Afraid, Rape, and Kick questionnaire    Fear of Current or Ex-Partner: No    Emotionally Abused: No    Physically Abused: No    Sexually Abused: No    Family History: No family history on file.  Current Medications:  Current Outpatient Medications:    fluticasone (FLONASE) 50 MCG/ACT nasal spray, Place into both nostrils. (Patient not taking: Reported on 08/28/2022), Disp: , Rfl:    gabapentin (NEURONTIN) 100 MG capsule, Take 100 mg by mouth 2 (two) times daily., Disp: , Rfl:    gabapentin (NEURONTIN) 300 MG capsule, Take 300 mg by mouth 3 (three) times daily., Disp: , Rfl:    levocetirizine (XYZAL) 5 MG tablet, Take 5 mg by mouth daily. (Patient not taking: Reported on 08/28/2022), Disp: , Rfl:    lisinopril (ZESTRIL) 20 MG tablet, Take 20 mg by mouth 2 (two) times daily., Disp: , Rfl:    propranolol (INDERAL) 40 MG tablet, Take 40 mg by mouth 2 (two) times daily., Disp: , Rfl:    QUEtiapine (SEROQUEL) 25 MG tablet, Take by mouth., Disp: , Rfl:    sildenafil (REVATIO) 20 MG tablet, Take 20 mg by mouth daily. (Patient not taking: Reported on 08/28/2022), Disp: , Rfl:    SYMBICORT 160-4.5 MCG/ACT inhaler, Inhale into the lungs. (Patient not taking: Reported on 08/28/2022), Disp: , Rfl:    traMADol (ULTRAM) 50 MG tablet, Take 50-100 mg by mouth 3 (three) times daily. (Patient not taking: Reported on 08/28/2022), Disp: , Rfl:    VENTOLIN HFA 108 (90 Base) MCG/ACT inhaler, Inhale into the lungs. (Patient not taking: Reported on 08/28/2022), Disp: , Rfl:    Allergies: Allergies   Allergen Reactions   Penicillins Rash   Hydrocodone Itching    REVIEW OF SYSTEMS:   Review of Systems  Constitutional:  Negative for chills, fatigue and fever.  HENT:   Negative for lump/mass, mouth sores, nosebleeds, sore throat and trouble swallowing.   Eyes:  Negative for eye problems.  Respiratory:  Positive for cough and shortness of breath.   Cardiovascular:  Positive for palpitations. Negative for chest pain and leg swelling.  Gastrointestinal:  Positive for constipation. Negative for abdominal pain, diarrhea, nausea and vomiting.  Genitourinary:  Negative for bladder incontinence, difficulty urinating, dysuria, frequency, hematuria and nocturia.   Musculoskeletal:  Negative for arthralgias, back pain, flank pain, myalgias and neck pain.  Skin:  Negative for itching and rash.  Neurological:  Positive for dizziness. Negative for headaches  and numbness.  Hematological:  Does not bruise/bleed easily.  Psychiatric/Behavioral:  Positive for sleep disturbance. Negative for depression and suicidal ideas. The patient is nervous/anxious.   All other systems reviewed and are negative.    VITALS:   There were no vitals taken for this visit.  Wt Readings from Last 3 Encounters:  08/26/22 197 lb 3.2 oz (89.4 kg)  02/17/20 205 lb (93 kg)    There is no height or weight on file to calculate BMI.   PHYSICAL EXAM:   Physical Exam Vitals and nursing note reviewed. Exam conducted with a chaperone present.  Constitutional:      Appearance: Normal appearance.  Cardiovascular:     Rate and Rhythm: Normal rate and regular rhythm.     Pulses: Normal pulses.     Heart sounds: Normal heart sounds.  Pulmonary:     Effort: Pulmonary effort is normal.     Breath sounds: Normal breath sounds.  Abdominal:     Palpations: Abdomen is soft. There is no hepatomegaly, splenomegaly or mass.     Tenderness: There is no abdominal tenderness.  Musculoskeletal:     Right lower leg: No edema.      Left lower leg: No edema.  Lymphadenopathy:     Cervical: No cervical adenopathy.     Right cervical: No superficial, deep or posterior cervical adenopathy.    Left cervical: No superficial, deep or posterior cervical adenopathy.     Upper Body:     Right upper body: No supraclavicular or axillary adenopathy.     Left upper body: No supraclavicular or axillary adenopathy.  Neurological:     General: No focal deficit present.     Mental Status: He is alert and oriented to person, place, and time.  Psychiatric:        Mood and Affect: Mood normal.        Behavior: Behavior normal.     LABS:      Latest Ref Rng & Units 08/26/2022   11:13 AM 02/17/2020   10:02 PM  CBC  WBC 4.0 - 10.5 K/uL 12.8  16.4   Hemoglobin 13.0 - 17.0 g/dL 34.7  42.5   Hematocrit 39.0 - 52.0 % 48.1  46.9   Platelets 150 - 400 K/uL 268  226       Latest Ref Rng & Units 02/17/2020   10:02 PM  CMP  Glucose 70 - 99 mg/dL 956   BUN 6 - 20 mg/dL 12   Creatinine 3.87 - 1.24 mg/dL 5.64   Sodium 332 - 951 mmol/L 135   Potassium 3.5 - 5.1 mmol/L 3.7   Chloride 98 - 111 mmol/L 102   CO2 22 - 32 mmol/L 27   Calcium 8.9 - 10.3 mg/dL 9.0      No results found for: "CEA1", "CEA" / No results found for: "CEA1", "CEA" No results found for: "PSA1" No results found for: "OAC166" No results found for: "CAN125"  No results found for: "TOTALPROTELP", "ALBUMINELP", "A1GS", "A2GS", "BETS", "BETA2SER", "GAMS", "MSPIKE", "SPEI" No results found for: "TIBC", "FERRITIN", "IRONPCTSAT" No results found for: "LDH"   STUDIES:   No results found.

## 2022-09-26 ENCOUNTER — Encounter (HOSPITAL_COMMUNITY): Payer: Self-pay

## 2022-09-26 ENCOUNTER — Encounter (HOSPITAL_COMMUNITY)
Admission: RE | Disposition: A | Discharge status: Home / Self Care | Payer: Self-pay | Source: Home / Self Care | Attending: Gastroenterology

## 2022-09-26 ENCOUNTER — Inpatient Hospital Stay: Payer: Medicaid Other | Admitting: Oncology

## 2022-09-26 ENCOUNTER — Ambulatory Visit (HOSPITAL_COMMUNITY): Payer: Medicaid Other | Admitting: Certified Registered Nurse Anesthetist

## 2022-09-26 ENCOUNTER — Other Ambulatory Visit: Payer: Self-pay

## 2022-09-26 ENCOUNTER — Ambulatory Visit (HOSPITAL_COMMUNITY)
Admission: RE | Admit: 2022-09-26 | Discharge: 2022-09-26 | Disposition: A | Discharge status: Home / Self Care | Payer: Medicaid Other | Attending: Gastroenterology | Admitting: Gastroenterology

## 2022-09-26 DIAGNOSIS — K644 Residual hemorrhoidal skin tags: Secondary | ICD-10-CM | POA: Insufficient documentation

## 2022-09-26 DIAGNOSIS — F17210 Nicotine dependence, cigarettes, uncomplicated: Secondary | ICD-10-CM | POA: Diagnosis not present

## 2022-09-26 DIAGNOSIS — I10 Essential (primary) hypertension: Secondary | ICD-10-CM | POA: Insufficient documentation

## 2022-09-26 DIAGNOSIS — Z980 Intestinal bypass and anastomosis status: Secondary | ICD-10-CM | POA: Diagnosis not present

## 2022-09-26 DIAGNOSIS — D126 Benign neoplasm of colon, unspecified: Secondary | ICD-10-CM

## 2022-09-26 DIAGNOSIS — K648 Other hemorrhoids: Secondary | ICD-10-CM

## 2022-09-26 DIAGNOSIS — D123 Benign neoplasm of transverse colon: Secondary | ICD-10-CM | POA: Insufficient documentation

## 2022-09-26 DIAGNOSIS — Z1211 Encounter for screening for malignant neoplasm of colon: Secondary | ICD-10-CM

## 2022-09-26 DIAGNOSIS — D122 Benign neoplasm of ascending colon: Secondary | ICD-10-CM | POA: Insufficient documentation

## 2022-09-26 DIAGNOSIS — K635 Polyp of colon: Secondary | ICD-10-CM

## 2022-09-26 DIAGNOSIS — F419 Anxiety disorder, unspecified: Secondary | ICD-10-CM | POA: Diagnosis not present

## 2022-09-26 HISTORY — PX: POLYPECTOMY: SHX5525

## 2022-09-26 HISTORY — PX: COLONOSCOPY WITH PROPOFOL: SHX5780

## 2022-09-26 HISTORY — DX: Essential (primary) hypertension: I10

## 2022-09-26 HISTORY — DX: Anxiety disorder, unspecified: F41.9

## 2022-09-26 LAB — HM COLONOSCOPY

## 2022-09-26 SURGERY — COLONOSCOPY WITH PROPOFOL
Anesthesia: General

## 2022-09-26 MED ORDER — PROPOFOL 10 MG/ML IV BOLUS
INTRAVENOUS | Status: DC | PRN
  Administered 2022-09-26: 100 mg via INTRAVENOUS

## 2022-09-26 MED ORDER — PROPOFOL 500 MG/50ML IV EMUL
INTRAVENOUS | Status: AC
  Filled 2022-09-26: qty 50

## 2022-09-26 MED ORDER — LACTATED RINGERS IV SOLN: INTRAVENOUS | Status: DC

## 2022-09-26 MED ORDER — LIDOCAINE HCL (CARDIAC) PF 100 MG/5ML IV SOSY
PREFILLED_SYRINGE | INTRAVENOUS | Status: DC | PRN
Start: 2022-09-26 — End: 2022-09-26
  Administered 2022-09-26: 60 mg via INTRAVENOUS

## 2022-09-26 MED ORDER — PROPOFOL 500 MG/50ML IV EMUL
INTRAVENOUS | Status: DC | PRN
  Administered 2022-09-26: 150 ug/kg/min via INTRAVENOUS

## 2022-09-26 NOTE — Anesthesia Preprocedure Evaluation (Signed)
Anesthesia Evaluation  Patient identified by MRN, date of birth, ID band Patient awake    Reviewed: Allergy & Precautions, H&P , NPO status , Patient's Chart, lab work & pertinent test results, reviewed documented beta blocker date and time   Airway Mallampati: II  TM Distance: >3 FB Neck ROM: full    Dental no notable dental hx.    Pulmonary neg pulmonary ROS, Current Smoker and Patient abstained from smoking.   Pulmonary exam normal breath sounds clear to auscultation       Cardiovascular Exercise Tolerance: Good hypertension, negative cardio ROS  Rhythm:regular Rate:Normal     Neuro/Psych  PSYCHIATRIC DISORDERS Anxiety     negative neurological ROS  negative psych ROS   GI/Hepatic negative GI ROS, Neg liver ROS,,,  Endo/Other  negative endocrine ROS    Renal/GU negative Renal ROS  negative genitourinary   Musculoskeletal   Abdominal   Peds  Hematology negative hematology ROS (+)   Anesthesia Other Findings   Reproductive/Obstetrics negative OB ROS                             Anesthesia Physical Anesthesia Plan  ASA: 2  Anesthesia Plan: General   Post-op Pain Management:    Induction:   PONV Risk Score and Plan: Propofol infusion  Airway Management Planned:   Additional Equipment:   Intra-op Plan:   Post-operative Plan:   Informed Consent: I have reviewed the patients History and Physical, chart, labs and discussed the procedure including the risks, benefits and alternatives for the proposed anesthesia with the patient or authorized representative who has indicated his/her understanding and acceptance.     Dental Advisory Given  Plan Discussed with: CRNA  Anesthesia Plan Comments:        Anesthesia Quick Evaluation

## 2022-09-26 NOTE — Transfer of Care (Signed)
Immediate Anesthesia Transfer of Care Note  Patient: HILDON SAVOY  Procedure(s) Performed: COLONOSCOPY WITH PROPOFOL POLYPECTOMY  Patient Location: Endoscopy Unit  Anesthesia Type:General  Level of Consciousness: awake, alert , and oriented  Airway & Oxygen Therapy: Patient Spontanous Breathing  Post-op Assessment: Report given to RN, Post -op Vital signs reviewed and stable, Patient moving all extremities X 4, and Patient able to stick tongue midline  Post vital signs: Reviewed and stable  Last Vitals:  Vitals Value Taken Time  BP 106/74   Temp 97.5   Pulse 55   Resp 20   SpO2 98     Last Pain:  Vitals:   09/26/22 0802  TempSrc: Oral  PainSc: 0-No pain      Patients Stated Pain Goal: 8 (09/26/22 0802)  Complications: No notable events documented.

## 2022-09-26 NOTE — H&P (Signed)
Primary Care Physician:  Kirstie Peri, MD Primary Gastroenterologist:  Dr. Tasia Catchings  Pre-Procedure History & Physical: HPI:  [name] is a 57 y.o. male is here for a colonoscopy for colon cancer screening purposes.   Patient with history of diverticulitis in 2009 with sigmoid resection . Last colonoscopy 2019    Patient denies any family history of colorectal cancer.  No melena or hematochezia.  No abdominal pain or unintentional weight loss.  No change in bowel habits.  Overall feels well from a GI standpoint.  Past Medical History:  Diagnosis Date   Anxiety    Diverticulitis    H/O left nephrectomy    Hypertension     Past Surgical History:  Procedure Laterality Date   APPENDECTOMY     COLONOSCOPY  2019   sigmoid removal      Prior to Admission medications   Medication Sig Start Date End Date Taking? Authorizing Provider  FLUoxetine (PROZAC) 20 MG capsule Take 20 mg by mouth every morning. 09/08/22  Yes [provider]  fluticasone (FLONASE) 50 MCG/ACT nasal spray Place into both nostrils. 08/19/22  Yes [provider]  gabapentin (NEURONTIN) 100 MG capsule Take 100 mg by mouth 2 (two) times daily. 07/23/22  Yes [provider]  gabapentin (NEURONTIN) 300 MG capsule Take 300 mg by mouth 3 (three) times daily. 07/23/22  Yes [provider]  levocetirizine (XYZAL) 5 MG tablet Take 5 mg by mouth daily. 07/24/22  Yes [provider]  lisinopril (ZESTRIL) 20 MG tablet Take 20 mg by mouth 2 (two) times daily. 07/03/22  Yes [provider]  propranolol (INDERAL) 40 MG tablet Take 40 mg by mouth 2 (two) times daily. 08/14/22  Yes [provider]  QUEtiapine (SEROQUEL) 25 MG tablet Take by mouth. 07/23/22  Yes [provider]  SYMBICORT 160-4.5 MCG/ACT inhaler Inhale into the lungs. 07/14/22  Yes [provider]  traMADol (ULTRAM) 50 MG tablet Take 50-100 mg by mouth 3 (three) times daily. 08/06/22  Yes [provider]  VENTOLIN HFA 108 (90 Base) MCG/ACT inhaler Inhale into the lungs. 08/19/22  Yes [provider]  sildenafil (REVATIO) 20 MG tablet Take 20 mg by mouth daily. 08/08/22   [provider]    Allergies as of 09/04/2022 - Review Complete 08/26/2022  Allergen Reaction Noted   Penicillins Rash 02/17/2020   Hydrocodone Itching 08/26/2022    History reviewed. No pertinent family history.  Social History   Socioeconomic History   Marital status: Single    Spouse name: Not on file   Number of children: Not on file   Years of education: Not on file   Highest education level: Not on file  Occupational History   Not on file  Tobacco Use   Smoking status: Some Days    Current packs/day: 0.50    Types: Cigarettes   Smokeless tobacco: Never  Vaping Use   Vaping status: Never Used  Substance and Sexual Activity   Alcohol use: Not Currently    Alcohol/week: 1.0 standard drink of alcohol    Types: 1 Cans of beer per week    Comment: occasionally   Drug use: Never   Sexual activity: Not Currently  Other Topics Concern   Not on file  Social History Narrative   Not on file   Social Determinants of Health   Financial Resource Strain: Not on file  Food Insecurity: No Food Insecurity (08/26/2022)   Hunger Vital Sign    Worried  About Running Out of Food in the Last Year: Never true    Ran Out of Food in the Last Year: Never true  Transportation Needs: No Transportation Needs (08/26/2022)   PRAPARE - Administrator, Civil Service (Medical): No    Lack of Transportation (Non-Medical): No  Physical Activity: Not on file  Stress: Not on file  Social Connections: Not on file  Intimate Partner Violence: Not At Risk (08/26/2022)   Humiliation, Afraid, Rape, and Kick questionnaire    Fear of Current or Ex-Partner: No    Emotionally Abused: No    Physically Abused: No    Sexually Abused: No    Review of Systems: See HPI, otherwise negative  ROS  Physical Exam: Vital signs in last 24 hours: Temp:  [98 F (36.7 C)] 98 F (36.7 C) (08/30 0802) Pulse Rate:  [55] 55 (08/30 0802) Resp:  [13] 13 (08/30 0802) BP: (147)/(95) 147/95 (08/30 0802) SpO2:  [98 %] 98 % (08/30 0802)   General:   Alert,  Well-developed, well-nourished, pleasant and cooperative in NAD Head:  Normocephalic and atraumatic. Eyes:  Sclera clear, no icterus.   Conjunctiva pink. Ears:  Normal auditory acuity. Nose:  No deformity, discharge,  or lesions. Msk:  Symmetrical without gross deformities. Normal posture. Extremities:  Without clubbing or edema. Neurologic:  Alert and  oriented x4;  grossly normal neurologically. Skin:  Intact without significant lesions or rashes. Psych:  Alert and cooperative. Normal mood and affect.  Impression/Plan: [name] is here for a colonoscopy to be performed for colon cancer screening purposes.  The risks of the procedure including infection, bleed, or perforation as well as benefits, limitations, alternatives and imponderables have been reviewed with the patient. Questions have been answered. All parties agreeable.

## 2022-09-26 NOTE — Op Note (Signed)
[facility] Patient Name: [name] Procedure Date: 09/26/2022 8:51 AM MRN: [id number] Date of Birth: January 02, 1966 Attending MD: Sanjuan Dame , MD, [id number] CSN: [id number] Age: 57 Admit Type: Outpatient Procedure:                Colonoscopy Indications:              Screening for colorectal malignant neoplasm Providers:                Sanjuan Dame, MD, Nena Polio, RN, Kristine L.                            Jessee Avers, Technician Referring MD:              Medicines:                Monitored Anesthesia Care Complications:            No immediate complications. Estimated Blood Loss:     Estimated blood loss: none. Procedure:                Pre-Anesthesia Assessment:                           - Prior to the procedure, a History and Physical                            was performed, and patient medications and                            allergies were reviewed. The patient's tolerance of                            previous anesthesia was also reviewed. The risks                            and benefits of the procedure and the sedation                            options and risks were discussed with the patient.                            All questions were answered, and informed consent                            was obtained. Prior Anticoagulants: The patient has                            taken no anticoagulant or antiplatelet agents. ASA                            Grade Assessment: II - A patient with mild systemic                            disease. After reviewing the risks and benefits,  the patient was deemed in satisfactory condition to                            undergo the procedure.                           After obtaining informed consent, the colonoscope                            was passed under direct vision. Throughout the                            procedure, the patient's blood pressure, pulse, and                             oxygen saturations were monitored continuously. The                            [id number]) scope was introduced through the                            anus and advanced to the the cecum, identified by                            appendiceal orifice and ileocecal valve. The                            colonoscopy was performed without difficulty. The                            patient tolerated the procedure well. The quality                            of the bowel preparation was evaluated using the                            BBPS [facility] Bowel Preparation Scale) with scores                            of: Right Colon = 3, Transverse Colon = 3 and Left                            Colon = 3 (entire mucosa seen well with no residual                            staining, small fragments of stool or opaque                            liquid). The total BBPS score equals 9. The                            ileocecal valve, appendiceal orifice, and rectum  were photographed. Scope In: 9:07:26 AM Scope Out: 9:36:42 AM Scope Withdrawal Time: 0 hours 26 minutes 47 seconds  Total Procedure Duration: 0 hours 29 minutes 16 seconds  Findings:      The perianal and digital rectal examinations were normal.      There was evidence of a prior side-to-side colo-colonic anastomosis in       the sigmoid colon. This was patent and was characterized by healthy       appearing mucosa. The anastomosis was traversed.      A 2 mm polyp was found in the ascending colon. The polyp was sessile.       The polyp was removed with a cold biopsy forceps. Resection and       retrieval were complete.      A 5 mm polyp was found in the transverse colon. The polyp was sessile.       The polyp was removed with a cold snare. Resection and retrieval were       complete.      Non-bleeding external and internal hemorrhoids were found during       retroflexion. Impression:               - Patent  side-to-side colo-colonic anastomosis from                            previous sigmoidectomy due to divertculitis,                            characterized by healthy appearing mucosa.                           - One 2 mm polyp in the ascending colon, removed                            with a cold biopsy forceps. Resected and retrieved.                           - One 5 mm polyp in the transverse colon, removed                            with a cold snare. Resected and retrieved.                           - Non-bleeding external and internal hemorrhoids. Moderate Sedation:      Per Anesthesia Care Recommendation:           - Patient has a contact number available for                            emergencies. The signs and symptoms of potential                            delayed complications were discussed with the                            patient. Return to normal activities tomorrow.  Written discharge instructions were provided to the                            patient.                           - Resume previous diet.                           - Continue present medications.                           - Await pathology results.                           - Repeat colonoscopy for surveillance based on                            pathology results.                           - Return to primary care physician as previously                            scheduled. Procedure Code(s):        --- Professional ---                           [phone], Colonoscopy, flexible; with removal of                            tumor(s), polyp(s), or other lesion(s) by snare                            technique                           45380, 59, Colonoscopy, flexible; with biopsy,                            single or multiple Diagnosis Code(s):        --- Professional ---                           Z12.11, Encounter for screening for malignant                            neoplasm of  colon                           Z98.0, Intestinal bypass and anastomosis status                           D12.2, Benign neoplasm of ascending colon                           D12.3, Benign neoplasm of transverse colon (hepatic  flexure or splenic flexure)                           K64.8, Other hemorrhoids CPT copyright 2022 American Medical Association. All rights reserved. The codes documented in this report are preliminary and upon coder review may  be revised to meet current compliance requirements. Sanjuan Dame, MD Sanjuan Dame, MD 09/26/2022 9:41:57 AM This report has been signed electronically. Number of Addenda: 0

## 2022-09-26 NOTE — Anesthesia Postprocedure Evaluation (Signed)
Anesthesia Post Note  Patient: [name]  Procedure(s) Performed: COLONOSCOPY WITH PROPOFOL POLYPECTOMY  Patient location during evaluation: Phase II Anesthesia Type: General Level of consciousness: awake Pain management: pain level controlled Vital Signs Assessment: post-procedure vital signs reviewed and stable Respiratory status: spontaneous breathing and respiratory function stable Cardiovascular status: blood pressure returned to baseline and stable Postop Assessment: no headache and no apparent nausea or vomiting Anesthetic complications: no Comments: Late entry   No notable events documented.   Last Vitals:  Vitals:   09/26/22 0802 09/26/22 0942  BP: (!) 147/95 106/74  Pulse: (!) 55 (!) 56  Resp: 13 20  Temp: 36.7 C (!) 36.4 C  SpO2: 98% 98%    Last Pain:  Vitals:   09/26/22 0942  TempSrc: Oral  PainSc: 0-No pain                 Windell Norfolk

## 2022-09-30 ENCOUNTER — Encounter (INDEPENDENT_AMBULATORY_CARE_PROVIDER_SITE_OTHER): Payer: Self-pay | Admitting: *Deleted

## 2022-10-02 ENCOUNTER — Encounter (HOSPITAL_COMMUNITY): Payer: Self-pay | Admitting: Gastroenterology

## 2022-10-02 LAB — SURGICAL PATHOLOGY

## 2022-10-06 NOTE — Progress Notes (Signed)
I reviewed the pathology results. [name], can you send her a letter with the findings as described below please? Repeat colonoscopy in 7 years  Thanks,  [name], MD Gastroenterology and Hepatology [facility]  ---------------------------------------------------------------------------------------------  [facility] [address], [place], [place] [address] Phone:  [phone]   10/06/22 [name], [place]   Dear [name],  I am writing to inform you that the biopsies taken during your recent endoscopic examination showed: Tubular Adenoma   I am writing to let you know the results of your recent colonoscopy.  You had a total of 2 polyps removed. The pathology came back as "tubular adenoma." These findings are NOT cancer, but had the polyps remained in your colon, they could have turned into cancer.  Given these findings, it is recommended that your next colonoscopy be performed in 7 years.  Please call us at [phone] if you have persistent problems or have questions about your condition that have not been fully answered at this time.  Sincerely,  [name], MD Gastroenterology and Hepatology

## 2022-10-07 ENCOUNTER — Encounter (INDEPENDENT_AMBULATORY_CARE_PROVIDER_SITE_OTHER): Payer: Self-pay | Admitting: *Deleted
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
## 2022-11-26 NOTE — Progress Notes (Signed)
 [facility] PHYSICAL THERAP[facility] 11/26/2022      Patient Name: Domenic Schoenberger Date of Birth:01/28/1966 Diagnosis:  Encounter Diagnosis  Name Primary?  . Low back pain, unspecified back pain laterality, unspecified chronicity, unspecified whether sciatica present Yes   Referring MD:  Teresa Jenkins HERO, FNP   Date of Onset of Impairment-No date available Date PT Care Plan Established or Reviewed-10/01/2022 Date PT Treatment Started-10/01/2022  Plan of Care Effective Date: 10/01/2022 - 11/30/2022    Visit 7/16 Re-assessment: 11/30/2022  Assessment/Plan:   Assessment Assessment details:    Patient responded well to PT treatment today. Intermixed lumbar and BLE stretching and strengthening exercises throughout treatment. Pt reported improved mobility in low back and minimal muscle fatigue after treatment session.  Impairments indicating medical necessity and functional limitations continue to include: significant core weakness, pain, and functional impairments that are limiting patient's ability to complete basic ADL's or home/community ambulation without difficulty. Treatment classification: lumbar extension bias. Patient will benefit from skilled PT intervention to address current body structure impairments and activity limitations and return to completing normal ADLs and IADLs without increased pain or difficulty. Thank you for this referral.       Impairments: core weakness, decreased strength, pain, decreased range of motion and impaired flexibility    Personal Factors/Comorbidities: 3+   Specific Comorbidities: Diverticulitis with colostomy with reversal in 2008, right inguinal hernia repair with mesh, L kidney removal at age 57, HTN, anxiety   Examination of Body Systems: musculoskeletal   Clinical Presentation: stable   Clinical Decision Making: low   Prognosis: good prognosis   Positive Prognosis Rationale: age, motivated for treatment, safety awareness and  response to trial tx. Negative Prognosis Rationale: chronicity of condition.   Therapy Goals     Goals:     Short-term Goals to be achieved in 4 weeks. Patient will: 1. Be independent with initial home program for self-management of symptoms. MET  2. Improve L-spine pain by 25% for improved mobility and completion of ADL's. PROGRESSING 3. Improve L-spine AROM 25% for improved mobility and completion of ADL's. PROGRESSING 4. Improve Modified Oswestry score by 9 points. PROGRESSING   Long-term Goals to be achieved in 8 weeks. Patient will: 1.  Demonstrate full pain free L-spine AROM in all planes. PROGRESSING 2.  Abolish all symptoms at onset independently 3/3 trials. PROGRESSING 3. Improve lumbar spine and core strength to 5/5 for improved general mobility. PROGRESSING 4. Complete community and recreational ambulation without onset of symptoms. PROGRESSING 5. Improve Modified Oswestry score by 15 points. PROGRESSING  Plan   Therapy options: will be seen for skilled physical therapy services   Planned therapy interventions: 20560, 20561-Dry Needling 1-2, 3+ areas, 97010-Cold Packs/Hot Packs, 02967, G0283-Electrical Stimulation (unattended, attended), 97110-Therapeutic Exercises, 97112-Neuromuscular Re-education, 97116-Gait Training, 97140-Manual Therapy, 97164-Re-evaluation (PT), 97530-Therapeutic Activities and 97750-Physical Performance Test    Frequency: 1-2 times per week.   Duration in weeks: 8   Education provided to: patient.   Education provided: HEP, Importance of Therapy, Back care, Body awareness, Body mechanics, Role of therapy in Rehabilitation and Safety education   Education results: verbalized good understanding, needs reinforcement and needs further instruction.   Communication/Consultation: N/A.   Next visit plan:       Progress with lumbar stabilization program as patient able to tolerate.    Total Session Time: 54   Treatment rendered today:     Therapeutic exercise  (58 minutes) -Recumbent bike performed to increase tissue warmth, increase bloodflow and mobility in  order to prep pt for continued therapeutic intervention.  Pt to perform 10 min lvl 6 -Standing gastrocnemius stretch on slantboard, 3 x 30 sec holds -Stretches on stairs, hip flexor / hamstring, 3 x 30 sec holds each bilaterally -Hip ABD req cues to stand in middle of platform with pad above the lateral aspect of knee, pt to stand upright and to keep toes pointed straight ahead.  Pt to perform 2x15 with 40# on each LE.  -Hip EXT req cues to stand in middle of platform with pad above the posterior aspect of distal hamstrings, pt to stand upright and to keep core activated.  Pt to perform 2x15 with 40# on each LE.  -Seated piriformis stretch, 3 x 30 sec holds bilaterally -Seated hamstring stretch with foot on stool and 30 sec holds x 3 reps bilaterally -Standing wall sags with 5 sec holds x 20 reps -Standing rolling swiss ball up wall with alternating UE lifts at end range x 20 reps -Standing rolling swiss ball up wall with alternating UE/LE lifts x 20 reps -Standing lumbar extension at mat table for anchor 2 x 10 repetitions -Supine hip adduction with peanut bolster combined with bridge and 3 sec holds x 20 reps -Supine with feet on swiss ball, SLR x 20 reps -Supine LTR with 10 sec holds x 10 reps bilaterally  97110 TE: Pt. was progressed to tolerance with BLE and lumbar/core strengthening & stretching exercises to increase pt.'s ability to meet functional goals. Pt. required demonstration & moderate verbal, visual, and tactile cues for proper exercise technique.  Physical performance testing (13 minutes) -Modified Oswestry Low Back pain Disability Questionnaire -Objective testing and measurements -Ascending and descending 6-inch steps (4 steps) x3 repetitions -Pt. given muscle energy technique (MET) to correct R anterior innominate dysfunction x10 repetitions trial 1 and x 5 repetitions trial  with good re-alignment noted and pt. reporting slightly decreased pain levels.  -Sit to stand x 10 repetitions -Hip FL req cues to stand in middle of platform with pad above the anterior aspect of distal thigh, pt to stand upright and to keep core activated.  Pt to perform 2x15 with 50# on each LE.   -alternating leg press 90# 2x30 -Lumbar Extension req cues to place feet underneath foot pads, arms across chest and perform extensions while maintaining core activation.  Pt performs 30x 140# Cable Column exercises: -Scapular rows req cues to stand upright, keep forearms level with ground and to squeeze with shoulder blades rather than pull with arms, and to adjust hand and/or feet placement to prevent LBP and to maintain balance.  Pt to perform 3x10 40# -SAPD (ext) req cues to stand upright, keep arms straight with elbows straight, to pull hands down towards side of hips by engaging posterior scapular muscles, and to adjust hand and/or feet placement to prevent LBP and to maintain balance.  Pt to perform 3x10 35# -Tricep Presses req cues to stand upright, step up to CC with elbows staying by their side and to straighten arms down towards hips.  Pt to perform 3x10 30# -Dynamic warmup activities performed to assess pt's progress with BLE capabilities.  Pt performs each of the following for 40 feet each.  Walking calf raises to assess calf activation, walking tip toe ambulation to assess calf endurance, walking heel ambulation to assess tib ant activation and endurance, high knee marching to assess hip FL and balance, butt kicks to assess quad tightness and hamstring activation, Frankenstein's to assess HS tightness -supermans performed with  forehead on towel roll.  Performed 5x20 holds -[place] swimming with forehead on towel roll.  Performed alternating shoulder and hip extensions.  2x20 -planks elbows/knees 5x20    Plan details: Intervention to progress with pain reduction, stabilization, core  strengthening, postural re-education, recovery of function and prophylaxis.  Modalities (heat, ice, ultrasound, electrical stimulation, dry needling, iontophoresis), as needed for pain management. Deep tissue mobilization and manual therapy as needed to increase ROM in restricted soft tissues and joints. Therapeutic exercise, Neuromuscular Re-education, physical performance test, therapeutic activity, and education.    Subjective:   History of Present Condition    Date of onset:  07/31/2022   Date is approximation?: yes   Explanation:  Patient reported progressively worsening low back pain over the past several months.    History of Present Condition/Chief Complaint:  Patient reported history of lumbar fractures January of 2022, however this was treated conservatively without surgery. Patient reported that he was recently taken off of Advil in March of 2024 which patient reported increased his low back pain.   Patient reported going to see a hematologist that diagnosed him with elevated white blood cell count. Of note, patient reported intermittent LLE swelling with insidious onset.  Subjective:  Patient reported low back stiffness and right hip pain upon presentation. Noted improved pain symptoms following previous treatment with ability to sleep through entire night without hip pain waking him up. Notes pain symptoms returned within a couple of days. Notes appointment with pain management next week.  Pain:    Current pain rating:  5   At best pain rating:  5   At worst pain rating:  10 Pain Comments: Patient reported increased pain with prolonged sitting and standing.    Location:  Low back   Quality:  Stiffness, knife-like, tightness and intermittent   Relieving factors:  As the day progresses, sitting and lying   Aggravating factors:  When still   Pain related Behaviors:  None   Progression:  Worsening Precautions/Equipment  Precautions:  None   Current Braces/Orthoses:  None    Equipment Currently Used:  None Current Functional Status:   disturbed sleep, limited exercise, limited lifting, limited recreation, limited standing tolerance, limited walking tolerance, limited household activities, limited sitting tolerance and limited work capacity Social Support:    Lives Environment:  One-story house   Lives with:  Significant other   Hand dominance:  Right   Communication Preference:  Verbal Barriers to Learning:  No Barriers Patient Goals:    Patient/Family goals for therapy:  Decreased pain, improved sleep, improved ambulation, increased ROM, increased strength, return to recreational activites, return to sport/leisure activities, return to work and independence with ADLs/IADLs   Objective:    Postural Observations Seated posture: fair Standing posture: fair Additional Postural Observation Details-Forward head, rounded shoulders noted  Muscle Activation  Additional Muscle Activation Details 3+/5 abdominal strength  Range of Motion     Flex= 100%, Ext=75 %, Rot R=100%, L=100%, Side Flex R=100%, L=100%. AROM limited by pain and stiffness at end range.   Tests    Functional Assessment  Oswestry Low Back Disability Index Oswestry Low Back Disability Index Score: 16 Assessment: 32% functional impairment  General Comments  Lumbar Spine Comments Supine 90/90 hamstring test: RLE: -25 degrees; LLE -35 degrees  Patient demonstrated functional leg length discrepancy with RLE being functional longer than left, however after muscle energy technique, patient reported immediate relief and functional leg length discrepancy was corrected.  I attest that I have reviewed the above information. Signed: Edsel Marina, PTA 11/26/2022 3:10 PM

## 2022-12-31 ENCOUNTER — Encounter: Payer: Self-pay | Admitting: Nurse Practitioner

## 2023-01-12 ENCOUNTER — Other Ambulatory Visit (HOSPITAL_COMMUNITY): Payer: Self-pay | Admitting: Nurse Practitioner

## 2023-01-12 ENCOUNTER — Encounter: Payer: Self-pay | Admitting: Nurse Practitioner

## 2023-01-12 DIAGNOSIS — G8929 Other chronic pain: Secondary | ICD-10-CM

## 2023-02-15 ENCOUNTER — Ambulatory Visit (HOSPITAL_COMMUNITY): Payer: Medicaid Other

## 2023-02-23 ENCOUNTER — Encounter (HOSPITAL_COMMUNITY): Payer: Self-pay

## 2023-02-23 ENCOUNTER — Ambulatory Visit (HOSPITAL_COMMUNITY): Payer: Medicaid Other

## 2023-09-07 ENCOUNTER — Encounter: Payer: Self-pay | Admitting: Cardiology

## 2023-09-09 ENCOUNTER — Other Ambulatory Visit: Payer: Self-pay

## 2023-09-09 DIAGNOSIS — D72820 Lymphocytosis (symptomatic): Secondary | ICD-10-CM

## 2023-09-09 NOTE — Progress Notes (Signed)
 Lab orders placed.

## 2023-09-10 ENCOUNTER — Inpatient Hospital Stay: Attending: Oncology

## 2023-09-10 DIAGNOSIS — D72825 Bandemia: Secondary | ICD-10-CM | POA: Insufficient documentation

## 2023-09-10 DIAGNOSIS — D72820 Lymphocytosis (symptomatic): Secondary | ICD-10-CM

## 2023-09-10 LAB — CBC WITH DIFFERENTIAL/PLATELET
Abs Immature Granulocytes: 0.09 K/uL — ABNORMAL HIGH (ref 0.00–0.07)
Basophils Absolute: 0.1 K/uL (ref 0.0–0.1)
Basophils Relative: 1 %
Eosinophils Absolute: 0.3 K/uL (ref 0.0–0.5)
Eosinophils Relative: 2 %
HCT: 46.7 % (ref 39.0–52.0)
Hemoglobin: 16.2 g/dL (ref 13.0–17.0)
Immature Granulocytes: 1 %
Lymphocytes Relative: 24 %
Lymphs Abs: 3 K/uL (ref 0.7–4.0)
MCH: 35.3 pg — ABNORMAL HIGH (ref 26.0–34.0)
MCHC: 34.7 g/dL (ref 30.0–36.0)
MCV: 101.7 fL — ABNORMAL HIGH (ref 80.0–100.0)
Monocytes Absolute: 1.5 K/uL — ABNORMAL HIGH (ref 0.1–1.0)
Monocytes Relative: 12 %
Neutro Abs: 7.4 K/uL (ref 1.7–7.7)
Neutrophils Relative %: 60 %
Platelets: 252 K/uL (ref 150–400)
RBC: 4.59 MIL/uL (ref 4.22–5.81)
RDW: 12.8 % (ref 11.5–15.5)
WBC: 12.4 K/uL — ABNORMAL HIGH (ref 4.0–10.5)
nRBC: 0 % (ref 0.0–0.2)

## 2023-09-10 LAB — COMPREHENSIVE METABOLIC PANEL WITH GFR
ALT: 30 U/L (ref 0–44)
AST: 37 U/L (ref 15–41)
Albumin: 3.8 g/dL (ref 3.5–5.0)
Alkaline Phosphatase: 95 U/L (ref 38–126)
Anion gap: 10 (ref 5–15)
BUN: 13 mg/dL (ref 6–20)
CO2: 23 mmol/L (ref 22–32)
Calcium: 8.9 mg/dL (ref 8.9–10.3)
Chloride: 104 mmol/L (ref 98–111)
Creatinine, Ser: 0.95 mg/dL (ref 0.61–1.24)
GFR, Estimated: >60 mL/min (ref >60)
Glucose, Bld: 111 mg/dL — ABNORMAL HIGH (ref 70–99)
Potassium: 4.4 mmol/L (ref 3.5–5.1)
Sodium: 137 mmol/L (ref 135–145)
Total Bilirubin: 1 mg/dL (ref 0.0–1.2)
Total Protein: 7.1 g/dL (ref 6.5–8.1)

## 2023-09-10 LAB — LACTATE DEHYDROGENASE: LDH: 148 U/L (ref 98–192)

## 2023-09-11 ENCOUNTER — Inpatient Hospital Stay: Payer: Medicaid Other

## 2023-09-11 ENCOUNTER — Encounter: Payer: Self-pay | Admitting: Cardiology

## 2023-09-11 ENCOUNTER — Ambulatory Visit: Attending: Cardiology | Admitting: Cardiology

## 2023-09-11 VITALS — BP 148/89 | Ht 74.0 in | Wt 236.0 lb

## 2023-09-11 DIAGNOSIS — R55 Syncope and collapse: Secondary | ICD-10-CM | POA: Diagnosis present

## 2023-09-11 NOTE — Patient Instructions (Signed)
 Medication Instructions:  Continue all current medications.  Labwork: none  Testing/Procedures: none  Follow-Up: 2 months   Any Other Special Instructions Will Be Listed Below (If Applicable).  If you need a refill on your cardiac medications before your next appointment, please call your pharmacy.

## 2023-09-11 NOTE — Progress Notes (Signed)
 Clinical Summary Mr. Benassi is a 58 y.o.male seen today as a new consult, referred by Dr Maree for the following medical problems   1.Syncope - pcp EKG - 08/2023 echo with pcp: LVEF 59% , grade I dd, normal RV, no significant valve pathology.    - 3-4 episodes over 2-3 episodes - chronic orthostatic dizziness  - last episode 3 weeks ago. Had not eating anything - was walking a package for delivery, felt lightheaded and dizzy and fell to the ground. Near syncope but not full consciousness loss.  - low bp's at evaluation.  - episodes always occur with standing.  - working to stay well hydrated. 1.9L of water daily. 1/2 cup of coffee, no tea, occasionally soda, no energy drinks. 3-4 beers at night, roughly 2-3 nights per week. -often comes on with hot temperatures. Worst in in summer.  - low bp's at times 80s/40 - pcp has cut back        Past Medical History:  Diagnosis Date   Anxiety    Diverticulitis    H/O left nephrectomy    Hypertension      Allergies  Allergen Reactions   Penicillins Rash   Hydrocodone  Itching     Current Outpatient Medications  Medication Sig Dispense Refill   FLUoxetine (PROZAC) 20 MG capsule Take 20 mg by mouth every morning.     fluticasone (FLONASE) 50 MCG/ACT nasal spray Place into both nostrils.     gabapentin (NEURONTIN) 100 MG capsule Take 100 mg by mouth 2 (two) times daily.     gabapentin (NEURONTIN) 300 MG capsule Take 300 mg by mouth 3 (three) times daily.     levocetirizine (XYZAL) 5 MG tablet Take 5 mg by mouth daily.     lisinopril (ZESTRIL) 20 MG tablet Take 20 mg by mouth 2 (two) times daily.     propranolol (INDERAL) 40 MG tablet Take 40 mg by mouth 2 (two) times daily.     QUEtiapine (SEROQUEL) 25 MG tablet Take by mouth.     sildenafil (REVATIO) 20 MG tablet Take 20 mg by mouth daily.     SYMBICORT 160-4.5 MCG/ACT inhaler Inhale into the lungs.     traMADol (ULTRAM) 50 MG tablet Take 50-100 mg by mouth 3 (three)  times daily.     VENTOLIN HFA 108 (90 Base) MCG/ACT inhaler Inhale into the lungs.     No current facility-administered medications for this visit.     Past Surgical History:  Procedure Laterality Date   APPENDECTOMY     COLONOSCOPY  2019   COLONOSCOPY WITH PROPOFOL  N/A 09/26/2022   Procedure: COLONOSCOPY WITH PROPOFOL ;  Surgeon: Cinderella Deatrice FALCON, MD;  [facility]: [facility];  Service: Endoscopy;  Laterality: N/A;  9:15AM;ASA 2   POLYPECTOMY  09/26/2022   Procedure: POLYPECTOMY;  Surgeon: Cinderella Deatrice FALCON, MD;  Location: [facility];  Service: Endoscopy;;   sigmoid removal       Allergies  Allergen Reactions   Penicillins Rash   Hydrocodone  Itching      No family history on file.   Social History Mr. Mendia reports that he has been smoking cigarettes. He has never used smokeless tobacco. Mr. Defelice reports that he does not currently use alcohol after a past usage of about 1.0 standard drink of alcohol per week.    Physical Examination Today's Vitals   09/11/23 1320  BP: (!) 148/89  SpO2: 96%  Weight: 236 lb (107 kg)  Height: 6' 2 (  1.88 m)   Body mass index is 30.3 kg/m.  Gen: resting comfortably, no acute distress HEENT: no scleral icterus, pupils equal round and reactive, no palptable cervical adenopathy,  CV: RRR, no m/rg, no jvd Resp: Clear to auscultation bilaterally GI: abdomen is soft, non-tender, non-distended, normal bowel sounds, no hepatosplenomegaly MSK: extremities are warm, no edema.  Skin: warm, no rash Neuro:  no focal deficits Psych: appropriate affect    Assessment and Plan  1.Syncope - by history most consistent with orthostatic syncope - episodes only occur while standing. 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 - outside of syncopal episodes he reports long history of chronic orthostatic dizziness - limited oral hydration, encouraged closer to 3  liters of water/electrolyte rich fluids. PCP has already cut back his bp meds - benign echo, has event monitor from pcp that will concluding within next few days - follow symptoms with increased hydration and with cutting back on bp meds, accept high bp's for now   F/u 2 months      Dorn PHEBE Ross, M.D.

## 2023-09-18 ENCOUNTER — Inpatient Hospital Stay: Payer: Medicaid Other | Admitting: Oncology

## 2023-09-18 NOTE — Progress Notes (Deleted)
   [facility] OFFICE PROGRESS NOTE  Maree Isles, MD  ASSESSMENT & PLAN:    Assessment & Plan Bandemia -Reviewed lab work from 08/26/2022 with patient and fianc. -Labs from 08/26/2022 show white count of 12.8, absolute lymphocyte count of 4.2 and absolute monocyte count of 1.3.  No evidence of a clotting disorder with normal PT, PTT and INR.  JAK2 with reflex to CALR/MPL and Exon 12-15 were negative.  Flow cytometry without evidence of monoclonal B-cell population. -There is no evidence of underlying leukemia or lymphoma based on labs. -Discussed case with Dr. Rogers who recommends follow-up annually. 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  Discussed in detail smoking cessation. -Recommend follow-up in 1 year with labs and see a provider a few days later.  No orders of the defined types were placed in this encounter.   INTERVAL HISTORY: Patient returns for follow-up.  We reviewed ***  SUMMARY OF HEMATOLOGIC HISTORY: Oncology History   No history exists.   1.  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 - No fever/weight loss.  Occasional night sweats.  Reports easy bruising. - No aquagenic pruritus/erythromelalgias/prior thrombosis.   2.  Social/family history: - He works as a [employer] fast streamer.  Denies any chemical exposure.  Smokes 3 to 4 cigarettes/day since in his mid 19s. - No family history of malignancy or leukemia.  CBC    Component Value Date/Time   WBC 12.4 (H) 09/10/2023 0931   RBC 4.59 09/10/2023 0931   HGB 16.2 09/10/2023 0931   HCT 46.7 09/10/2023 0931   PLT 252 09/10/2023 0931   MCV 101.7 (H) 09/10/2023 0931   MCH 35.3 (H) 09/10/2023 0931   MCHC 34.7 09/10/2023 0931   RDW 12.8 09/10/2023 0931   LYMPHSABS 3.0 09/10/2023 0931   MONOABS 1.5 (H) 09/10/2023 0931   EOSABS 0.3 09/10/2023 0931   BASOSABS 0.1 09/10/2023 0931       Latest Ref Rng & Units 09/10/2023     9:31 AM 02/17/2020   10:02 PM  CMP  Glucose 70 - 99 mg/dL 888  891   BUN 6 - 20 mg/dL 13  12   Creatinine 9.38 - 1.24 mg/dL 9.04  8.98   Sodium 864 - 145 mmol/L 137  135   Potassium 3.5 - 5.1 mmol/L 4.4  3.7   Chloride 98 - 111 mmol/L 104  102   CO2 22 - 32 mmol/L 23  27   Calcium 8.9 - 10.3 mg/dL 8.9  9.0   Total Protein 6.5 - 8.1 g/dL 7.1    Total Bilirubin 0.0 - 1.2 mg/dL 1.0    Alkaline Phos 38 - 126 U/L 95    AST 15 - 41 U/L 37    ALT 0 - 44 U/L 30       No results found for: FERRITIN, VITAMINB12  There were no vitals filed for this visit.  Review of System:  ROS  Physical Exam: Physical Exam   I spent *** minutes dedicated to the care of this patient (face-to-face and non-face-to-face) on the date of the encounter to include what is described in the assessment and plan.,  Delon Hope, NP 09/18/2023 9:33 AM

## 2023-09-18 NOTE — Assessment & Plan Note (Deleted)
-  Reviewed lab work from 08/26/2022 with patient and fianc. -Labs from 08/26/2022 show white count of 12.8, absolute lymphocyte count of 4.2 and absolute monocyte count of 1.3.  No evidence of a clotting disorder with normal PT, PTT and INR.  JAK2 with reflex to CALR/MPL and Exon 12-15 were negative.  Flow cytometry without evidence of monoclonal B-cell population. -There is no evidence of underlying leukemia or lymphoma based on labs. -Discussed case with Dr. Rogers who recommends follow-up annually. 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  Discussed in detail smoking cessation. -Recommend follow-up in 1 year with labs and see a provider a few days later.

## 2023-09-23 ENCOUNTER — Ambulatory Visit: Payer: Self-pay | Admitting: Cardiology

## 2023-09-30 ENCOUNTER — Inpatient Hospital Stay: Attending: Oncology | Admitting: Oncology

## 2023-09-30 ENCOUNTER — Inpatient Hospital Stay

## 2023-09-30 ENCOUNTER — Ambulatory Visit: Payer: Self-pay | Admitting: Oncology

## 2023-09-30 VITALS — BP 96/66 | HR 52 | Temp 98.0°F | Resp 18 | Wt 239.0 lb

## 2023-09-30 DIAGNOSIS — R5383 Other fatigue: Secondary | ICD-10-CM

## 2023-09-30 DIAGNOSIS — R0602 Shortness of breath: Secondary | ICD-10-CM | POA: Diagnosis not present

## 2023-09-30 DIAGNOSIS — D72825 Bandemia: Secondary | ICD-10-CM | POA: Diagnosis present

## 2023-09-30 DIAGNOSIS — R55 Syncope and collapse: Secondary | ICD-10-CM | POA: Diagnosis not present

## 2023-09-30 DIAGNOSIS — R232 Flushing: Secondary | ICD-10-CM | POA: Insufficient documentation

## 2023-09-30 DIAGNOSIS — E559 Vitamin D deficiency, unspecified: Secondary | ICD-10-CM | POA: Insufficient documentation

## 2023-09-30 DIAGNOSIS — F17210 Nicotine dependence, cigarettes, uncomplicated: Secondary | ICD-10-CM | POA: Diagnosis not present

## 2023-09-30 DIAGNOSIS — Z8781 Personal history of (healed) traumatic fracture: Secondary | ICD-10-CM | POA: Diagnosis not present

## 2023-09-30 DIAGNOSIS — Z9081 Acquired absence of spleen: Secondary | ICD-10-CM | POA: Diagnosis not present

## 2023-09-30 LAB — CBC WITH DIFFERENTIAL/PLATELET
Abs Immature Granulocytes: 0.06 K/uL (ref 0.00–0.07)
Basophils Absolute: 0.1 K/uL (ref 0.0–0.1)
Basophils Relative: 1 %
Eosinophils Absolute: 0.2 K/uL (ref 0.0–0.5)
Eosinophils Relative: 2 %
HCT: 43.3 % (ref 39.0–52.0)
Hemoglobin: 15.1 g/dL (ref 13.0–17.0)
Immature Granulocytes: 1 %
Lymphocytes Relative: 28 %
Lymphs Abs: 2.9 K/uL (ref 0.7–4.0)
MCH: 35.9 pg — ABNORMAL HIGH (ref 26.0–34.0)
MCHC: 34.9 g/dL (ref 30.0–36.0)
MCV: 102.9 fL — ABNORMAL HIGH (ref 80.0–100.0)
Monocytes Absolute: 1.5 K/uL — ABNORMAL HIGH (ref 0.1–1.0)
Monocytes Relative: 14 %
Neutro Abs: 5.7 K/uL (ref 1.7–7.7)
Neutrophils Relative %: 54 %
Platelets: 213 K/uL (ref 150–400)
RBC: 4.21 MIL/uL — ABNORMAL LOW (ref 4.22–5.81)
RDW: 12.6 % (ref 11.5–15.5)
WBC: 10.6 K/uL — ABNORMAL HIGH (ref 4.0–10.5)
nRBC: 0 % (ref 0.0–0.2)

## 2023-09-30 LAB — TSH: TSH: 1.534 u[IU]/mL (ref 0.350–4.500)

## 2023-09-30 LAB — POTASSIUM: Potassium: 4.3 mmol/L (ref 3.5–5.1)

## 2023-09-30 LAB — MAGNESIUM: Magnesium: 2.1 mg/dL (ref 1.7–2.4)

## 2023-09-30 LAB — IRON AND TIBC
Iron: 93 ug/dL (ref 45–182)
Saturation Ratios: 28 % (ref 17.9–39.5)
TIBC: 334 ug/dL (ref 250–450)
UIBC: 241 ug/dL

## 2023-09-30 LAB — FERRITIN: Ferritin: 729 ng/mL — ABNORMAL HIGH (ref 24–336)

## 2023-09-30 LAB — FOLATE: Folate: 15.6 ng/mL (ref >5.9)

## 2023-09-30 LAB — VITAMIN B12: Vitamin B-12: 340 pg/mL (ref 180–914)

## 2023-09-30 NOTE — Progress Notes (Signed)
 [facility] OFFICE PROGRESS NOTE  Maree Isles, MD  ASSESSMENT & PLAN:    Assessment & Plan Bandemia .-Previous hematology workup from July 2024 was essentially unremarkable.  White count had improved to 12.8.  No evidence of clotting disorder with normal PT and PTT.  INR was WNL.  JAK2 with reflex was negative.  Flow cytometry without evidence of monoclonal B-cell population. -Reviewed labs from 09/10/2023 with patient.  Overall, stable. -Patient is having daily hot flashes and more recently has developed syncopal episodes. -She has been followed by cardiology and his PCP and there have been several changes to his medications. 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 -Recommend annual follow-up for leukocytosis. Other fatigue We discussed new onset fatigue, dizziness and syncope. Reports he has had several adjustments made to his BP meds with persistent symptoms. Reports he recently had an echocardiogram which was benign. Patient also had a event monitor placed and we will get results back from that in the next few days. We discussed adding on nutritional labs from today to see if that revealed any concerning findings. Call with results.  Orders Placed This Encounter  Procedures   VITAMIN D 25 Hydroxy (Vit-D Deficiency, Fractures)    Standing Status:   Future    Expected Date:   09/30/2023    Expiration Date:   12/29/2023   Vitamin B12    Standing Status:   Future    Number of Occurrences:   1    Expected Date:   09/30/2023    Expiration Date:   12/29/2023   Ferritin    Standing Status:   Future    Number of Occurrences:   1    Expected Date:   09/30/2023    Expiration Date:   12/29/2023   Iron and TIBC ([facility] ONLY)    Standing Status:   Future    Number of Occurrences:   1    Expected Date:   09/30/2023    Expiration Date:   12/29/2023   Copper , serum    Standing Status:   Future    Number of Occurrences:   1     Expected Date:   09/30/2023    Expiration Date:   12/29/2023   Magnesium    Standing Status:   Future    Number of Occurrences:   1    Expected Date:   09/30/2023    Expiration Date:   12/29/2023   Potassium    Standing Status:   Future    Number of Occurrences:   1    Expected Date:   09/30/2023    Expiration Date:   12/29/2023   Folate    Standing Status:   Future    Number of Occurrences:   1    Expected Date:   09/30/2023    Expiration Date:   12/29/2023   CBC with Differential    Standing Status:   Future    Number of Occurrences:   1    Expected Date:   09/30/2023    Expiration Date:   12/29/2023   Methylmalonic acid, serum    Standing Status:   Future    Number of Occurrences:   1    Expected Date:   09/30/2023    Expiration Date:   12/29/2023   TSH    Standing Status:   Future    Number of Occurrences:   1    Expiration Date:   09/29/2024  INTERVAL HISTORY: Patient returns for follow-up.  In the interim, patient had a colonoscopy on 09/26/2022 which showed several polyps in the colon and nonbleeding external and internal hemorrhoids.  Anastomosis from previous sigmoidectomy due to diverticulitis which looked normal with healthy appearing mucosa.  Pathology was positive for tubular adenoma.  Repeat in 7 years.  He was evaluated at [facility] physical therapy for low back pain from September 24 to November 2024.  Patient had an echocardiogram in August 2025 which showed LVEF 59% normal RV grade 1 DD and no significant valve pathology.  Unfortunately, patient has had several episodes of syncope and orthostatic dizziness.  Currently wearing a heart monitor with cardiology.  Having diarrhea over the past few days/week.  Reports a little bit of blood in his stool this is likely secondary to hemorrhoids.  Occasional panic attacks at nighttime.  Had some shortness of breath with exertion.  Has stable chronic back pain.  Appetite is 50% energy levels are 25%.  [place] reports he has daily drenching  sweats rotating from day and night.  BP has been on the lower end of normal cardiology cannot figure out why.  We reviewed CMP, CBC and LDH.  SUMMARY OF HEMATOLOGIC HISTORY: Oncology History   No history exists.   1.  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 - No fever/weight loss.  Occasional night sweats.  Reports easy bruising. - No aquagenic pruritus/erythromelalgias/prior thrombosis.   2.  Social/family history: - He works as a [employer] fast streamer.  Denies any chemical exposure.  Smokes 3 to 4 cigarettes/day since in his mid 37s. - No family history of malignancy or leukemia.  CBC    Component Value Date/Time   WBC 10.6 (H) 09/30/2023 1344   RBC 4.21 (L) 09/30/2023 1344   HGB 15.1 09/30/2023 1344   HCT 43.3 09/30/2023 1344   PLT 213 09/30/2023 1344   MCV 102.9 (H) 09/30/2023 1344   MCH 35.9 (H) 09/30/2023 1344   MCHC 34.9 09/30/2023 1344   RDW 12.6 09/30/2023 1344   LYMPHSABS 2.9 09/30/2023 1344   MONOABS 1.5 (H) 09/30/2023 1344   EOSABS 0.2 09/30/2023 1344   BASOSABS 0.1 09/30/2023 1344       Latest Ref Rng & Units 09/30/2023    1:44 PM 09/10/2023    9:31 AM 02/17/2020   10:02 PM  CMP  Glucose 70 - 99 mg/dL  888  891   BUN 6 - 20 mg/dL  13  12   Creatinine 9.38 - 1.24 mg/dL  9.04  8.98   Sodium 864 - 145 mmol/L  137  135   Potassium 3.5 - 5.1 mmol/L 4.3  4.4  3.7   Chloride 98 - 111 mmol/L  104  102   CO2 22 - 32 mmol/L  23  27   Calcium 8.9 - 10.3 mg/dL  8.9  9.0   Total Protein 6.5 - 8.1 g/dL  7.1    Total Bilirubin 0.0 - 1.2 mg/dL  1.0    Alkaline Phos 38 - 126 U/L  95    AST 15 - 41 U/L  37    ALT 0 - 44 U/L  30       Lab Results  Component Value Date   FERRITIN 729 (H) 09/30/2023   VITAMINB12 340 09/30/2023    Vitals:   09/30/23 1320  BP: 96/66  Pulse: (!) 52  Resp: 18  Temp: 98 F (36.7 C)  SpO2: 96%  Review of System:  Review of Systems  Constitutional:  Positive for malaise/fatigue.   Respiratory:  Positive for shortness of breath.   Cardiovascular:  Positive for chest pain.  Gastrointestinal:  Positive for diarrhea, nausea and vomiting.  Neurological:  Positive for headaches.  Psychiatric/Behavioral:  Positive for depression. The patient is nervous/anxious.     Physical Exam: Physical Exam Constitutional:      Appearance: Normal appearance.  HENT:     Head: Normocephalic and atraumatic.  Eyes:     Pupils: Pupils are equal, round, and reactive to light.  Cardiovascular:     Rate and Rhythm: Normal rate and regular rhythm.     Heart sounds: Normal heart sounds. No murmur heard. Pulmonary:     Effort: Pulmonary effort is normal.     Breath sounds: Normal breath sounds. No wheezing.  Abdominal:     General: Bowel sounds are normal. There is no distension.     Palpations: Abdomen is soft.     Tenderness: There is no abdominal tenderness.  Musculoskeletal:        General: Normal range of motion.     Cervical back: Normal range of motion.  Skin:    General: Skin is warm and dry.     Findings: No rash.  Neurological:     Mental Status: He is alert and oriented to person, place, and time.     Gait: Gait is intact.  Psychiatric:        Mood and Affect: Mood and affect normal.        Cognition and Memory: Memory normal.        Judgment: Judgment normal.      I spent 25 minutes dedicated to the care of this patient (face-to-face and non-face-to-face) on the date of the encounter to include what is described in the assessment and plan.,  Delon Hope, NP 09/30/2023 3:49 PM

## 2023-09-30 NOTE — Assessment & Plan Note (Addendum)
.-  Previous hematology workup from July 2024 was essentially unremarkable.  White count had improved to 12.8.  No evidence of clotting disorder with normal PT and PTT.  INR was WNL.  JAK2 with reflex was negative.  Flow cytometry without evidence of monoclonal B-cell population. -Reviewed labs from 09/10/2023 with patient.  Overall, stable. -Patient is having daily hot flashes and more recently has developed syncopal episodes. -She has been followed by cardiology and his PCP and there have been several changes to his medications. 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 -Recommend annual follow-up for leukocytosis.

## 2023-10-02 LAB — COPPER, SERUM: Copper: 83 ug/dL (ref 69–132)

## 2023-10-03 LAB — METHYLMALONIC ACID, SERUM: Methylmalonic Acid, Quantitative: 203 nmol/L (ref 0–378)

## 2023-10-16 ENCOUNTER — Telehealth: Payer: Self-pay

## 2023-10-16 NOTE — Telephone Encounter (Signed)
 Per Dr. 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 The pauses in the report were 3:28 AM, 12:57 AM, 3:06 AM. Im assuming he is asleep during these times. First steps would be to come off the propanolol which slows the heart rate and can causes pauses. He needs to touch base with PCP about this since they started it. Would keep his Oct 9 appt with us .  Patient informed and verbalized understanding of plan.

## 2023-10-16 NOTE — Telephone Encounter (Signed)
 Per Dr. Alvan 10/16/2023 @12 :24 pm- Pauses noted but all were nocturnal so would be less worrisome and I think less likely to have been involved in his syncope. He is on a medication that slows the heart down called propranolol. Can he tell us  why he has been on this medication?  Patient informed and reports he takes propranolol 20 mg twice daily prescribed by his PCP for anxiety and BP. Also says that PCP said he had pauses and lower HR's during the day time and mentioned needing a PPM. Will forward response to provider.

## 2023-10-16 NOTE — Telephone Encounter (Signed)
 Patient walked in with ZIO monitor results that Dr. Maree had ordered. Dr. Maree said that he needed a sooner appt. He was scheduled with [facility] on 10/22. Moved it up to 10/9 with [facility]. Adding him to a wait list. He wanted Branch to take a look at the results. I have scanned them into media and routed it to Dr. Alvan.

## 2023-11-05 ENCOUNTER — Encounter: Payer: Self-pay | Admitting: Nurse Practitioner

## 2023-11-05 ENCOUNTER — Ambulatory Visit: Attending: Nurse Practitioner | Admitting: Nurse Practitioner

## 2023-11-05 ENCOUNTER — Ambulatory Visit: Admitting: Nurse Practitioner

## 2023-11-05 VITALS — BP 160/95 | HR 106 | Ht 74.0 in | Wt 237.0 lb

## 2023-11-05 DIAGNOSIS — I10 Essential (primary) hypertension: Secondary | ICD-10-CM | POA: Diagnosis not present

## 2023-11-05 DIAGNOSIS — R000 Tachycardia, unspecified: Secondary | ICD-10-CM | POA: Insufficient documentation

## 2023-11-05 DIAGNOSIS — I951 Orthostatic hypotension: Secondary | ICD-10-CM | POA: Insufficient documentation

## 2023-11-05 DIAGNOSIS — F419 Anxiety disorder, unspecified: Secondary | ICD-10-CM | POA: Diagnosis not present

## 2023-11-05 DIAGNOSIS — F1090 Alcohol use, unspecified, uncomplicated: Secondary | ICD-10-CM | POA: Insufficient documentation

## 2023-11-05 DIAGNOSIS — Z720 Tobacco use: Secondary | ICD-10-CM | POA: Insufficient documentation

## 2023-11-05 DIAGNOSIS — R29818 Other symptoms and signs involving the nervous system: Secondary | ICD-10-CM | POA: Diagnosis present

## 2023-11-05 NOTE — Progress Notes (Addendum)
 Cardiology Office Note   Date:  11/05/2023  ID:  [name], DOB 13-Aug-1965, MRN [id number] PCP: Maree Isles, MD  [facility] Providers Cardiologist:  Alvan Carrier, MD     History of Present Illness [name] is a 58 y.o. male with a PMH of syncope, chronic orthostatic dizziness, hypertension, diverticulitis, history of left nephrectomy, and anxiety, who presents today for 28-month follow-up.  Last seen by Dr. Alvan on September 11, 2023.  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  Low BPs at evaluation.  It was noted from echo August 2025 with PCP showed LVEF 59%, grade 1 DD.  Patient noted limited oral hydration, BP meds were cut back by PCP.  Dr. Alvan recommended follow symptoms to increase hydration and with cutting back on BP meds and to except high BPs for now.  Today presents for 37-month follow-up appointment.  He states he notices labile BP readings from 190/133 in AM to evening 140 systolic and dropping as low as 80/40 around once per week.  Does admit to anxiety and has recently started taking BuSpar, says he notices his BP starts to improve after taking this medication.  Also admits to not eating meals at times, says he does not feel hungry.  PCP has been adjusting his medication to help with his blood pressure.Does admit to waking up frequently during the night, having what sounds like panic attacks at times at night.  Has never been tested for sleep apnea in the past. 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 Denies any chest pain, shortness of breath, syncope, orthopnea, PND, swelling or significant weight changes, acute bleeding, or claudication. Denies any recent syncope and no longer on propranolol as a recent heart monitor showed pauses at night - see below.  He  tells me he is seeing a psychiatrist for his anxiety, [facility].  ROS: Negative. See HPI.   SH: Does wear a nicotine patch and occasionally smokes cigarettes while wearing this.  Drinks about 2-3 beers per week.  Denies any illicit drug use.  Studies Reviewed  EKG: EKG is not ordered today.   ZIO monitor 09/2023: Preliminary findings Patient had min heart rate of 32 bpm, max heart rate of 136 bpm, and average heart rate 67 bpm.  Predominant underlying rhythm was sinus rhythm.  Slight P wave morphology changes were noted.  1 run of SVT lasting 4 beats, max rate 136 bpm, 119 pauses occurred with the longest lasting 5.2 seconds.  Isolated SVE's were rare, along with rare SVE couplets and SVE triplets were rare.  Isolated VE's were rare along with VE couplets and no VE triplets were present.  Ventricular trigeminy was present.  Echo see full report scanned under CV Proc  Risk Assessment/Calculations  HYPERTENSION CONTROL Vitals:   11/05/23 0912 11/05/23 0930  BP: (!) 162/88 (!) 160/95    The patient's blood pressure is elevated above target today.  In order to address the patient's elevated BP: Blood pressure will be monitored at home to determine if medication changes need to be made.; Follow up with general cardiology has been recommended.          Physical Exam VS:  BP (!) 160/95   Pulse (!) 106   Ht 6' 2 (1.88 m)   Wt 237 lb (107.5  kg)   SpO2 99%   BMI 30.43 kg/m        Wt Readings from Last 3 Encounters:  11/05/23 237 lb (107.5 kg)  09/30/23 238 lb 15.7 oz (108.4 kg)  09/11/23 236 lb (107 kg)    GEN: Well nourished, well developed in no acute distress, appears anxious/nervous NECK: No JVD; No carotid bruits CARDIAC: S1/S2, RRR, no murmurs, rubs, gallops RESPIRATORY:  Clear to auscultation without rales, wheezing or rhonchi  ABDOMEN: Soft, non-tender, non-distended EXTREMITIES:  No edema; No deformity   ASSESSMENT AND PLAN  Orthostatic  syncope, HTN, tachycardia Self-reported history of orthostatic syncope-has had orthostatics done with another provider that he says did show drop suddenly of SBP with standing.  Denies any recent syncopal episodes but admits to near syncope.  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  I believe one of the major underlying causes is his anxiety that is contributing to his elevated BP readings.  Also instructed him about staying well-hydrated, wearing compression stockings, and good electrolyte replacement.  Instructed him to cut back on his alcohol use per week. We will bring him back in 2-3 weeks for a BP check and he will monitor and log his BP and bring this log sheet to this visit.  PCP has made adjustments to his BP medications.  Heart rate is slightly elevated today, more than likely due to anxiety.  Most recent echocardiogram benign. No medication changes at this time.  Blood pressure on recheck today was 160/95.  Agree with Dr. Alvan that we will accept high BP's for now. Care and ED precautions discussed.   Anxiety Continue BuSpar and continue to follow-up with psychiatry. Continue to follow with PCP.  Suspected sleep apnea Pauses on monitor as noted above happened at nighttime, no longer on Propranolol, admits to waking up in the middle of the night frequently, elevated BMI, will refer him to Pulmonology for OSA evaluation.   4. Tobacco use and alcohol use Encouraged him to cut back on alcohol use. Smoking cessation encouraged and discussed.    Dispo: Care and ED precautions discussed.  Follow-up with MD/APP in 6 weeks or sooner if any changes.  Signed, Almarie Crate, NP

## 2023-11-05 NOTE — Patient Instructions (Signed)
 Medication Instructions:  Your physician recommends that you continue on your current medications as directed. Please refer to the Current Medication list given to you today.   Labwork: None today  Testing/Procedures: None today  Follow-Up: 6 weeks with E.Peck,NP   2-3 week nurse visit for BP check  Any Other Special Instructions Will Be Listed Below (If Applicable).  Referral sent to pulmonary. They will call you to schedule an appointment.  If you need a refill on your cardiac medications before your next appointment, please call your pharmacy.

## 2023-11-11 ENCOUNTER — Encounter (INDEPENDENT_AMBULATORY_CARE_PROVIDER_SITE_OTHER): Payer: Self-pay | Admitting: Gastroenterology

## 2023-11-18 ENCOUNTER — Ambulatory Visit: Admitting: Student

## 2023-11-18 ENCOUNTER — Telehealth: Payer: Self-pay | Admitting: Cardiology

## 2023-11-18 NOTE — Telephone Encounter (Signed)
 Currently being treated for anxiety and depression by Jacques Naegeli PA at [facility]. Jacques says he is wanting to increase Buspar to 15 mg TID and add Xanax 1 mg daily prn. Jacques mentioned that there were potential concerns about the patient having an autonomic disorder and he wanted to consult with cardiology before making adjustments to his medications. Jacques is requesting a call back with response and if he is unable to answer, the response can be faxed to his office [phone]. Will forward to provider for response.

## 2023-11-18 NOTE — Telephone Encounter (Signed)
 Caller Michail Naegeli) wants a call back to discuss patient's cardiac medications and medication he wants to prescribe for the patient.  Caller noted patient is currently in office.

## 2023-11-19 ENCOUNTER — Ambulatory Visit

## 2023-11-23 NOTE — Telephone Encounter (Signed)
 Response faxed to [facility] to the attention of Jacques Naegeli PA fax # [phone]

## 2023-11-24 ENCOUNTER — Encounter: Payer: Self-pay | Admitting: Nurse Practitioner

## 2023-12-01 ENCOUNTER — Encounter (HOSPITAL_COMMUNITY): Payer: Self-pay | Admitting: Emergency Medicine

## 2023-12-01 ENCOUNTER — Inpatient Hospital Stay (HOSPITAL_COMMUNITY)
Admission: EM | Admit: 2023-12-01 | Discharge: 2023-12-12 | DRG: 083 | Disposition: A | Discharge status: Home / Self Care | Attending: Internal Medicine | Admitting: Internal Medicine

## 2023-12-01 ENCOUNTER — Other Ambulatory Visit: Payer: Self-pay

## 2023-12-01 ENCOUNTER — Emergency Department (HOSPITAL_COMMUNITY)

## 2023-12-01 DIAGNOSIS — F10920 Alcohol use, unspecified with intoxication, uncomplicated: Secondary | ICD-10-CM

## 2023-12-01 DIAGNOSIS — Z885 Allergy status to narcotic agent status: Secondary | ICD-10-CM

## 2023-12-01 DIAGNOSIS — Y92009 Unspecified place in unspecified non-institutional (private) residence as the place of occurrence of the external cause: Secondary | ICD-10-CM

## 2023-12-01 DIAGNOSIS — R519 Headache, unspecified: Secondary | ICD-10-CM | POA: Insufficient documentation

## 2023-12-01 DIAGNOSIS — S01111A Laceration without foreign body of right eyelid and periocular area, initial encounter: Secondary | ICD-10-CM | POA: Diagnosis present

## 2023-12-01 DIAGNOSIS — Z905 Acquired absence of kidney: Secondary | ICD-10-CM

## 2023-12-01 DIAGNOSIS — R402362 Coma scale, best motor response, obeys commands, at arrival to emergency department: Secondary | ICD-10-CM | POA: Diagnosis present

## 2023-12-01 DIAGNOSIS — I951 Orthostatic hypotension: Secondary | ICD-10-CM | POA: Diagnosis present

## 2023-12-01 DIAGNOSIS — Z79899 Other long term (current) drug therapy: Secondary | ICD-10-CM

## 2023-12-01 DIAGNOSIS — Z880 Allergy status to penicillin: Secondary | ICD-10-CM

## 2023-12-01 DIAGNOSIS — J449 Chronic obstructive pulmonary disease, unspecified: Secondary | ICD-10-CM | POA: Diagnosis present

## 2023-12-01 DIAGNOSIS — S0101XA Laceration without foreign body of scalp, initial encounter: Secondary | ICD-10-CM | POA: Diagnosis present

## 2023-12-01 DIAGNOSIS — S12300A Unspecified displaced fracture of fourth cervical vertebra, initial encounter for closed fracture: Secondary | ICD-10-CM | POA: Diagnosis present

## 2023-12-01 DIAGNOSIS — Z860100 Personal history of colon polyps, unspecified: Secondary | ICD-10-CM

## 2023-12-01 DIAGNOSIS — W1830XA Fall on same level, unspecified, initial encounter: Secondary | ICD-10-CM | POA: Diagnosis present

## 2023-12-01 DIAGNOSIS — G43909 Migraine, unspecified, not intractable, without status migrainosus: Secondary | ICD-10-CM | POA: Diagnosis present

## 2023-12-01 DIAGNOSIS — S022XXA Fracture of nasal bones, initial encounter for closed fracture: Secondary | ICD-10-CM | POA: Diagnosis present

## 2023-12-01 DIAGNOSIS — M549 Dorsalgia, unspecified: Secondary | ICD-10-CM | POA: Diagnosis present

## 2023-12-01 DIAGNOSIS — Y908 Blood alcohol level of 240 mg/100 ml or more: Secondary | ICD-10-CM | POA: Diagnosis present

## 2023-12-01 DIAGNOSIS — I10 Essential (primary) hypertension: Secondary | ICD-10-CM | POA: Diagnosis present

## 2023-12-01 DIAGNOSIS — R0902 Hypoxemia: Secondary | ICD-10-CM | POA: Diagnosis present

## 2023-12-01 DIAGNOSIS — S12391A Other nondisplaced fracture of fourth cervical vertebra, initial encounter for closed fracture: Secondary | ICD-10-CM

## 2023-12-01 DIAGNOSIS — S0181XA Laceration without foreign body of other part of head, initial encounter: Secondary | ICD-10-CM

## 2023-12-01 DIAGNOSIS — R680 Hypothermia, not associated with low environmental temperature: Secondary | ICD-10-CM | POA: Diagnosis present

## 2023-12-01 DIAGNOSIS — S066XAA Traumatic subarachnoid hemorrhage with loss of consciousness status unknown, initial encounter: Secondary | ICD-10-CM | POA: Diagnosis present

## 2023-12-01 DIAGNOSIS — Z9049 Acquired absence of other specified parts of digestive tract: Secondary | ICD-10-CM

## 2023-12-01 DIAGNOSIS — F32A Depression, unspecified: Secondary | ICD-10-CM | POA: Diagnosis present

## 2023-12-01 DIAGNOSIS — R402252 Coma scale, best verbal response, oriented, at arrival to emergency department: Secondary | ICD-10-CM | POA: Diagnosis present

## 2023-12-01 DIAGNOSIS — G901 Familial dysautonomia [Riley-Day]: Secondary | ICD-10-CM | POA: Diagnosis present

## 2023-12-01 DIAGNOSIS — F17210 Nicotine dependence, cigarettes, uncomplicated: Secondary | ICD-10-CM | POA: Diagnosis present

## 2023-12-01 DIAGNOSIS — F419 Anxiety disorder, unspecified: Secondary | ICD-10-CM | POA: Diagnosis present

## 2023-12-01 DIAGNOSIS — K2100 Gastro-esophageal reflux disease with esophagitis, without bleeding: Secondary | ICD-10-CM | POA: Diagnosis present

## 2023-12-01 DIAGNOSIS — R402142 Coma scale, eyes open, spontaneous, at arrival to emergency department: Secondary | ICD-10-CM | POA: Diagnosis present

## 2023-12-01 DIAGNOSIS — S065XAA Traumatic subdural hemorrhage with loss of consciousness status unknown, initial encounter: Principal | ICD-10-CM | POA: Diagnosis present

## 2023-12-01 DIAGNOSIS — E871 Hypo-osmolality and hyponatremia: Secondary | ICD-10-CM | POA: Diagnosis present

## 2023-12-01 DIAGNOSIS — Z8249 Family history of ischemic heart disease and other diseases of the circulatory system: Secondary | ICD-10-CM

## 2023-12-01 DIAGNOSIS — R296 Repeated falls: Secondary | ICD-10-CM | POA: Diagnosis present

## 2023-12-01 DIAGNOSIS — E872 Acidosis, unspecified: Secondary | ICD-10-CM | POA: Diagnosis present

## 2023-12-01 DIAGNOSIS — M25562 Pain in left knee: Secondary | ICD-10-CM | POA: Diagnosis present

## 2023-12-01 DIAGNOSIS — Z7951 Long term (current) use of inhaled steroids: Secondary | ICD-10-CM

## 2023-12-01 DIAGNOSIS — G8929 Other chronic pain: Secondary | ICD-10-CM | POA: Diagnosis present

## 2023-12-01 DIAGNOSIS — F10129 Alcohol abuse with intoxication, unspecified: Secondary | ICD-10-CM | POA: Diagnosis present

## 2023-12-01 LAB — CBC
HCT: 47.1 % (ref 39.0–52.0)
Hemoglobin: 16.6 g/dL (ref 13.0–17.0)
MCH: 35.5 pg — ABNORMAL HIGH (ref 26.0–34.0)
MCHC: 35.2 g/dL (ref 30.0–36.0)
MCV: 100.6 fL — ABNORMAL HIGH (ref 80.0–100.0)
Platelets: 272 K/uL (ref 150–400)
RBC: 4.68 MIL/uL (ref 4.22–5.81)
RDW: 12.5 % (ref 11.5–15.5)
WBC: 16.6 K/uL — ABNORMAL HIGH (ref 4.0–10.5)
nRBC: 0 % (ref 0.0–0.2)

## 2023-12-01 LAB — COMPREHENSIVE METABOLIC PANEL WITH GFR
ALT: 51 U/L — ABNORMAL HIGH (ref 0–44)
AST: 53 U/L — ABNORMAL HIGH (ref 15–41)
Albumin: 4.2 g/dL (ref 3.5–5.0)
Alkaline Phosphatase: 112 U/L (ref 38–126)
Anion gap: 17 — ABNORMAL HIGH (ref 5–15)
BUN: 10 mg/dL (ref 6–20)
CO2: 20 mmol/L — ABNORMAL LOW (ref 22–32)
Calcium: 9.2 mg/dL (ref 8.9–10.3)
Chloride: 95 mmol/L — ABNORMAL LOW (ref 98–111)
Creatinine, Ser: 0.97 mg/dL (ref 0.61–1.24)
GFR, Estimated: >60 mL/min (ref >60)
Glucose, Bld: 118 mg/dL — ABNORMAL HIGH (ref 70–99)
Potassium: 3.7 mmol/L (ref 3.5–5.1)
Sodium: 132 mmol/L — ABNORMAL LOW (ref 135–145)
Total Bilirubin: 0.4 mg/dL (ref 0.0–1.2)
Total Protein: 7.4 g/dL (ref 6.5–8.1)

## 2023-12-01 LAB — LACTIC ACID, PLASMA: Lactic Acid, Venous: 4.5 mmol/L (ref 0.5–1.9)

## 2023-12-01 LAB — TROPONIN T, HIGH SENSITIVITY: Troponin T High Sensitivity: <15 ng/L (ref 0–19)

## 2023-12-01 LAB — PROTIME-INR
INR: 0.9 (ref 0.8–1.2)
Prothrombin Time: 13.1 s (ref 11.4–15.2)

## 2023-12-01 LAB — ETHANOL: Alcohol, Ethyl (B): 285 mg/dL — ABNORMAL HIGH (ref <15)

## 2023-12-01 LAB — CK: Total CK: 86 U/L (ref 49–397)

## 2023-12-01 MED ORDER — SODIUM CHLORIDE 0.9 % IV BOLUS
1000.0000 mL | Freq: Once | INTRAVENOUS | Status: AC
  Administered 2023-12-01: 1000 mL via INTRAVENOUS

## 2023-12-01 MED ORDER — LACTATED RINGERS IV BOLUS: 1000.0000 mL | Freq: Once | INTRAVENOUS | Status: DC

## 2023-12-01 NOTE — ED Triage Notes (Signed)
 Pt fell on carport unwitnessed after drinking etoh. Unknown time down outside, C collar placed by ems.

## 2023-12-02 ENCOUNTER — Emergency Department (HOSPITAL_COMMUNITY)

## 2023-12-02 DIAGNOSIS — S0990XA Unspecified injury of head, initial encounter: Secondary | ICD-10-CM | POA: Diagnosis not present

## 2023-12-02 DIAGNOSIS — G901 Familial dysautonomia [Riley-Day]: Secondary | ICD-10-CM

## 2023-12-02 DIAGNOSIS — Y92009 Unspecified place in unspecified non-institutional (private) residence as the place of occurrence of the external cause: Secondary | ICD-10-CM | POA: Diagnosis not present

## 2023-12-02 DIAGNOSIS — S065XAA Traumatic subdural hemorrhage with loss of consciousness status unknown, initial encounter: Principal | ICD-10-CM | POA: Diagnosis present

## 2023-12-02 DIAGNOSIS — M549 Dorsalgia, unspecified: Secondary | ICD-10-CM | POA: Diagnosis present

## 2023-12-02 DIAGNOSIS — S022XXA Fracture of nasal bones, initial encounter for closed fracture: Secondary | ICD-10-CM | POA: Diagnosis present

## 2023-12-02 DIAGNOSIS — S066XAA Traumatic subarachnoid hemorrhage with loss of consciousness status unknown, initial encounter: Secondary | ICD-10-CM | POA: Diagnosis present

## 2023-12-02 DIAGNOSIS — J449 Chronic obstructive pulmonary disease, unspecified: Secondary | ICD-10-CM | POA: Diagnosis present

## 2023-12-02 DIAGNOSIS — S12300A Unspecified displaced fracture of fourth cervical vertebra, initial encounter for closed fracture: Secondary | ICD-10-CM | POA: Diagnosis present

## 2023-12-02 DIAGNOSIS — R680 Hypothermia, not associated with low environmental temperature: Secondary | ICD-10-CM | POA: Diagnosis present

## 2023-12-02 DIAGNOSIS — S0101XA Laceration without foreign body of scalp, initial encounter: Secondary | ICD-10-CM | POA: Diagnosis present

## 2023-12-02 DIAGNOSIS — K209 Esophagitis, unspecified without bleeding: Secondary | ICD-10-CM | POA: Diagnosis not present

## 2023-12-02 DIAGNOSIS — Z880 Allergy status to penicillin: Secondary | ICD-10-CM | POA: Diagnosis not present

## 2023-12-02 DIAGNOSIS — E872 Acidosis, unspecified: Secondary | ICD-10-CM | POA: Diagnosis present

## 2023-12-02 DIAGNOSIS — R296 Repeated falls: Secondary | ICD-10-CM | POA: Diagnosis present

## 2023-12-02 DIAGNOSIS — R0789 Other chest pain: Secondary | ICD-10-CM | POA: Diagnosis not present

## 2023-12-02 DIAGNOSIS — W19XXXA Unspecified fall, initial encounter: Secondary | ICD-10-CM

## 2023-12-02 DIAGNOSIS — E871 Hypo-osmolality and hyponatremia: Secondary | ICD-10-CM | POA: Diagnosis present

## 2023-12-02 DIAGNOSIS — S01111A Laceration without foreign body of right eyelid and periocular area, initial encounter: Secondary | ICD-10-CM | POA: Diagnosis present

## 2023-12-02 DIAGNOSIS — F17210 Nicotine dependence, cigarettes, uncomplicated: Secondary | ICD-10-CM | POA: Diagnosis present

## 2023-12-02 DIAGNOSIS — G8929 Other chronic pain: Secondary | ICD-10-CM | POA: Diagnosis present

## 2023-12-02 DIAGNOSIS — Z8249 Family history of ischemic heart disease and other diseases of the circulatory system: Secondary | ICD-10-CM | POA: Diagnosis not present

## 2023-12-02 DIAGNOSIS — I10 Essential (primary) hypertension: Secondary | ICD-10-CM | POA: Diagnosis present

## 2023-12-02 DIAGNOSIS — Z905 Acquired absence of kidney: Secondary | ICD-10-CM | POA: Diagnosis not present

## 2023-12-02 DIAGNOSIS — F10920 Alcohol use, unspecified with intoxication, uncomplicated: Secondary | ICD-10-CM | POA: Diagnosis present

## 2023-12-02 DIAGNOSIS — F419 Anxiety disorder, unspecified: Secondary | ICD-10-CM | POA: Diagnosis present

## 2023-12-02 DIAGNOSIS — S0181XA Laceration without foreign body of other part of head, initial encounter: Secondary | ICD-10-CM | POA: Diagnosis present

## 2023-12-02 DIAGNOSIS — F10129 Alcohol abuse with intoxication, unspecified: Secondary | ICD-10-CM | POA: Diagnosis present

## 2023-12-02 DIAGNOSIS — Z885 Allergy status to narcotic agent status: Secondary | ICD-10-CM | POA: Diagnosis not present

## 2023-12-02 DIAGNOSIS — S12391A Other nondisplaced fracture of fourth cervical vertebra, initial encounter for closed fracture: Secondary | ICD-10-CM | POA: Diagnosis present

## 2023-12-02 DIAGNOSIS — R55 Syncope and collapse: Secondary | ICD-10-CM

## 2023-12-02 DIAGNOSIS — Y908 Blood alcohol level of 240 mg/100 ml or more: Secondary | ICD-10-CM | POA: Diagnosis present

## 2023-12-02 DIAGNOSIS — I951 Orthostatic hypotension: Secondary | ICD-10-CM | POA: Diagnosis not present

## 2023-12-02 DIAGNOSIS — F32A Depression, unspecified: Secondary | ICD-10-CM | POA: Diagnosis present

## 2023-12-02 DIAGNOSIS — Z7401 Bed confinement status: Secondary | ICD-10-CM | POA: Diagnosis not present

## 2023-12-02 DIAGNOSIS — W1830XA Fall on same level, unspecified, initial encounter: Secondary | ICD-10-CM | POA: Diagnosis present

## 2023-12-02 DIAGNOSIS — Z7951 Long term (current) use of inhaled steroids: Secondary | ICD-10-CM | POA: Diagnosis not present

## 2023-12-02 LAB — COMPREHENSIVE METABOLIC PANEL WITH GFR
ALT: 40 U/L (ref 0–44)
AST: 45 U/L — ABNORMAL HIGH (ref 15–41)
Albumin: 3.1 g/dL — ABNORMAL LOW (ref 3.5–5.0)
Alkaline Phosphatase: 71 U/L (ref 38–126)
Anion gap: 12 (ref 5–15)
BUN: 8 mg/dL (ref 6–20)
CO2: 20 mmol/L — ABNORMAL LOW (ref 22–32)
Calcium: 8 mg/dL — ABNORMAL LOW (ref 8.9–10.3)
Chloride: 104 mmol/L (ref 98–111)
Creatinine, Ser: 0.97 mg/dL (ref 0.61–1.24)
GFR, Estimated: >60 mL/min (ref >60)
Glucose, Bld: 94 mg/dL (ref 70–99)
Potassium: 4.3 mmol/L (ref 3.5–5.1)
Sodium: 136 mmol/L (ref 135–145)
Total Bilirubin: 0.8 mg/dL (ref 0.0–1.2)
Total Protein: 6 g/dL — ABNORMAL LOW (ref 6.5–8.1)

## 2023-12-02 LAB — CBC
HCT: 42.4 % (ref 39.0–52.0)
Hemoglobin: 14.6 g/dL (ref 13.0–17.0)
MCH: 35.1 pg — ABNORMAL HIGH (ref 26.0–34.0)
MCHC: 34.4 g/dL (ref 30.0–36.0)
MCV: 101.9 fL — ABNORMAL HIGH (ref 80.0–100.0)
Platelets: 263 K/uL (ref 150–400)
RBC: 4.16 MIL/uL — ABNORMAL LOW (ref 4.22–5.81)
RDW: 12.3 % (ref 11.5–15.5)
WBC: 17.5 K/uL — ABNORMAL HIGH (ref 4.0–10.5)
nRBC: 0 % (ref 0.0–0.2)

## 2023-12-02 LAB — MAGNESIUM: Magnesium: 1.9 mg/dL (ref 1.7–2.4)

## 2023-12-02 LAB — URINALYSIS, ROUTINE W REFLEX MICROSCOPIC
Bacteria, UA: NONE SEEN
Bilirubin Urine: NEGATIVE
Glucose, UA: NEGATIVE mg/dL
Ketones, ur: NEGATIVE mg/dL
Leukocytes,Ua: NEGATIVE
Nitrite: NEGATIVE
Protein, ur: NEGATIVE mg/dL
Specific Gravity, Urine: 1.004 — ABNORMAL LOW (ref 1.005–1.030)
pH: 5 (ref 5.0–8.0)

## 2023-12-02 LAB — URINE DRUG SCREEN
Amphetamines: NEGATIVE
Barbiturates: NEGATIVE
Benzodiazepines: POSITIVE — AB
Cocaine: NEGATIVE
Fentanyl: NEGATIVE
Methadone Scn, Ur: NEGATIVE
Opiates: NEGATIVE
Tetrahydrocannabinol: NEGATIVE

## 2023-12-02 LAB — LACTIC ACID, PLASMA
Lactic Acid, Venous: 2.3 mmol/L (ref 0.5–1.9)
Lactic Acid, Venous: 2 mmol/L (ref 0.5–1.9)

## 2023-12-02 LAB — ECHOCARDIOGRAM COMPLETE
Area-P 1/2: 3.89 cm2
Height: 74 in
S' Lateral: 2.7 cm
Weight: 3791.91 [oz_av]

## 2023-12-02 LAB — PHOSPHORUS: Phosphorus: 3.9 mg/dL (ref 2.5–4.6)

## 2023-12-02 LAB — TROPONIN T, HIGH SENSITIVITY: Troponin T High Sensitivity: <15 ng/L (ref 0–19)

## 2023-12-02 LAB — HIV ANTIBODY (ROUTINE TESTING W REFLEX): HIV Screen 4th Generation wRfx: NONREACTIVE

## 2023-12-02 MED ORDER — MORPHINE SULFATE (PF) 4 MG/ML IV SOLN
4.0000 mg | Freq: Once | INTRAVENOUS | Status: DC
  Filled 2023-12-02: qty 1

## 2023-12-02 MED ORDER — ACETAMINOPHEN 325 MG PO TABS: 650.0000 mg | ORAL_TABLET | Freq: Four times a day (QID) | ORAL | Status: DC | PRN

## 2023-12-02 MED ORDER — HYDROCODONE-ACETAMINOPHEN 5-325 MG PO TABS
1.0000 | ORAL_TABLET | Freq: Three times a day (TID) | ORAL | Status: DC | PRN
Start: 2023-12-02 — End: 2023-12-02

## 2023-12-02 MED ORDER — HYDROMORPHONE HCL 1 MG/ML IJ SOLN
1.0000 mg | Freq: Once | INTRAMUSCULAR | Status: AC
  Administered 2023-12-02: 1 mg via INTRAVENOUS
  Filled 2023-12-02: qty 1

## 2023-12-02 MED ORDER — SODIUM CHLORIDE 0.9 % IV SOLN: Freq: Once | INTRAVENOUS | Status: AC

## 2023-12-02 MED ORDER — ACETAMINOPHEN 650 MG RE SUPP: 650.0000 mg | Freq: Four times a day (QID) | RECTAL | Status: DC | PRN

## 2023-12-02 MED ORDER — OXYCODONE HCL 5 MG PO TABS
5.0000 mg | ORAL_TABLET | ORAL | Status: DC | PRN
  Administered 2023-12-02 – 2023-12-08 (×18): 10 mg via ORAL
  Administered 2023-12-08: 5 mg via ORAL
  Administered 2023-12-08: 10 mg via ORAL
  Administered 2023-12-08: 5 mg via ORAL
  Administered 2023-12-09: 10 mg via ORAL
  Administered 2023-12-09: 5 mg via ORAL
  Administered 2023-12-09 – 2023-12-11 (×8): 10 mg via ORAL
  Administered 2023-12-12: 5 mg via ORAL
  Administered 2023-12-12 (×2): 10 mg via ORAL
  Filled 2023-12-02: qty 1
  Filled 2023-12-02 (×23): qty 2
  Filled 2023-12-02: qty 1
  Filled 2023-12-02 (×3): qty 2
  Filled 2023-12-02: qty 1
  Filled 2023-12-02 (×9): qty 2

## 2023-12-02 MED ORDER — PNEUMOCOCCAL 20-VAL CONJ VACC 0.5 ML IM SUSY
0.5000 mL | PREFILLED_SYRINGE | INTRAMUSCULAR | Status: DC
  Filled 2023-12-02: qty 0.5

## 2023-12-02 MED ORDER — PANTOPRAZOLE SODIUM 40 MG IV SOLR
40.0000 mg | INTRAVENOUS | Status: DC
  Administered 2023-12-02: 40 mg via INTRAVENOUS
  Filled 2023-12-02: qty 10

## 2023-12-02 MED ORDER — POLYETHYLENE GLYCOL 3350 17 G PO PACK
17.0000 g | PACK | Freq: Every day | ORAL | Status: DC
  Filled 2023-12-02 (×2): qty 1

## 2023-12-02 MED ORDER — HYDROMORPHONE HCL 1 MG/ML IJ SOLN
0.5000 mg | INTRAMUSCULAR | Status: DC | PRN
  Administered 2023-12-02 (×2): 0.5 mg via INTRAVENOUS
  Filled 2023-12-02: qty 0.5
  Filled 2023-12-02: qty 1

## 2023-12-02 MED ORDER — LACTATED RINGERS IV SOLN: INTRAVENOUS | Status: AC

## 2023-12-02 MED ORDER — ONDANSETRON HCL 4 MG PO TABS
4.0000 mg | ORAL_TABLET | Freq: Four times a day (QID) | ORAL | Status: DC | PRN
  Administered 2023-12-03: 4 mg via ORAL
  Filled 2023-12-02 (×2): qty 1

## 2023-12-02 MED ORDER — ONDANSETRON HCL 4 MG/2ML IJ SOLN
4.0000 mg | Freq: Four times a day (QID) | INTRAMUSCULAR | Status: DC | PRN
  Administered 2023-12-02 – 2023-12-11 (×2): 4 mg via INTRAVENOUS
  Filled 2023-12-02 (×3): qty 2

## 2023-12-02 MED ORDER — HYDROMORPHONE HCL 1 MG/ML IJ SOLN
0.5000 mg | INTRAMUSCULAR | Status: DC | PRN
  Administered 2023-12-03 – 2023-12-06 (×11): 0.5 mg via INTRAVENOUS
  Filled 2023-12-02 (×12): qty 0.5

## 2023-12-02 MED ORDER — METHOCARBAMOL 500 MG PO TABS
1000.0000 mg | ORAL_TABLET | Freq: Three times a day (TID) | ORAL | Status: DC
  Administered 2023-12-02 – 2023-12-06 (×11): 1000 mg via ORAL
  Filled 2023-12-02 (×12): qty 2

## 2023-12-02 MED ORDER — KETOROLAC TROMETHAMINE 15 MG/ML IJ SOLN: 30.0000 mg | Freq: Four times a day (QID) | INTRAMUSCULAR | Status: DC

## 2023-12-02 MED ORDER — LIDOCAINE HCL (PF) 1 % IJ SOLN
10.0000 mL | Freq: Once | INTRAMUSCULAR | Status: AC
  Administered 2023-12-02: 10 mL
  Filled 2023-12-02: qty 10

## 2023-12-02 MED ORDER — TETANUS-DIPHTH-ACELL PERTUSSIS 5-2-15.5 LF-MCG/0.5 IM SUSP
0.5000 mL | Freq: Once | INTRAMUSCULAR | Status: DC
  Filled 2023-12-02: qty 0.5

## 2023-12-02 MED ORDER — SODIUM CHLORIDE 0.9 % IV BOLUS
1000.0000 mL | Freq: Once | INTRAVENOUS | Status: AC
  Administered 2023-12-02: 1000 mL via INTRAVENOUS

## 2023-12-02 MED ORDER — ACETAMINOPHEN 500 MG PO TABS
1000.0000 mg | ORAL_TABLET | Freq: Four times a day (QID) | ORAL | Status: DC
  Administered 2023-12-02 – 2023-12-03 (×4): 1000 mg via ORAL
  Filled 2023-12-02 (×5): qty 2

## 2023-12-02 NOTE — ED Provider Notes (Signed)
 I assumed care of this patient from previous provider.  Please see their note for further details of history, exam, and MDM.   Briefly patient is a 58 y.o. male who presented as a transfer from [facility] after a fall at home resulting in multiple subdural hematomas, C4 TP fracture.  Patient was admitted to the hospital service after trauma surgery and neurosurgery was consulted.  Sent here due to prolonged wait time for bed availability.  Patient is currently hemodynamically stable.         Trine Raynell Moder, MD 12/02/23 [phone]

## 2023-12-02 NOTE — H&P (Addendum)
 History and Physical    Patient: [name] FMW:[id number] DOB: Oct 16, 1965 DOA: 12/01/2023 DOS: the patient was seen and examined on 12/02/2023 PCP: Maree Isles, MD  Patient coming from: Home  Chief Complaint:  Chief Complaint  Patient presents with   Fall   HPI: [name] is a 58 y.o. male with medical history significant of hypertension, chronic back pain, GERD who presents to the emergency department via EMS after an unwitnessed fall on the driveway where he sustained a right forehead scalp laceration and hematoma.  EMS was activated, c-collar was placed and patient was sent to the ED for further evaluation and management.  Apparently, patient has been having several episodes and falls related to syncope, he has been following up with his PCP and cardiologist and was told that he had dysautonomia and orthostatic hypotension.    ED course In the emergency department, temperature initially was 94.8 F, this increased to 97.1 F.  Other vital signs were within normal range.  Workup in the ED showed normal CBC except for WBC of 16.6 and MCV of 100.6.  BMP showed sodium 132, potassium 3.7, chloride 95, bicarb 20, blood glucose 118, BUN/creatinine 10/0.97.  Alcohol level 285, lactic acid 4.5, troponin x 2 was flat at < 15, urine drug screen was positive for benzodiazepine, urinalysis was normal. CT head without contrast showed no acute  bilateral convexity subdural hematomas, left greater than right, with associated interhemispheric/ para falcine hemorrhage and possible trace 1-2 mm rightward shift. Small subarachnoid hemorrhage at the right frontal vertex. Globular extra-axial collections at the right vertex, possibly focal subdural or subarachnoid blood, measuring up to 8 mm. Moderate right forehead scalp laceration and hematoma. CT of the face without contrast showed acute mildly depressed nasal bone fracture.  Moderate right forehead laceration and hematoma with moderate-to-marked  right periorbital hematoma.  CT cervical spine without contrast was suggestive of a questionable nondisplaced C4 spinous process fracture  CT chest, abdomen and pelvis without contrast was suggestive of esophagitis IV hydration and Dilaudid  for pain was given. Trauma surgeon Dr. Lyndel who recommended admitting patient to [facility] service at [facility] per  EDP Neurosurgery (Dr. Colon) was consulted and recommended admitting patient to [facility] and to repeat CTA head in 12 hours. Patient will be evaluated by trauma and/for neurosurgery per EDP.  Due to bed not being available at [facility] till this afternoon, patient was sent to ED to ED with the accepting provider being Dr. Trine.   Review of Systems: As mentioned in the history of present illness. All other systems reviewed and are negative. Past Medical History:  Diagnosis Date   Anxiety    Diverticulitis    H/O left nephrectomy    Hypertension    Past Surgical History:  Procedure Laterality Date   APPENDECTOMY     COLONOSCOPY  2019   COLONOSCOPY WITH PROPOFOL  N/A 09/26/2022   Procedure: COLONOSCOPY WITH PROPOFOL ;  Surgeon: Cinderella Deatrice FALCON, MD;  Location: [facility];  Service: Endoscopy;  Laterality: N/A;  9:15AM;ASA 2   POLYPECTOMY  09/26/2022   Procedure: POLYPECTOMY;  Surgeon: Cinderella Deatrice FALCON, MD;  Location: [facility];  Service: Endoscopy;;   sigmoid removal     Social History:  reports that he has been smoking cigarettes. He has never used smokeless tobacco. He reports that he does not currently use alcohol after a past usage of about 1.0 standard drink of alcohol per week. He reports that he does not use drugs.  Allergies  Allergen Reactions   Penicillins Rash   Hydrocodone  Itching    Family History  Problem Relation Age of Onset   Hypertension Mother    Hypertension Father     Prior to Admission medications   Medication Sig Start Date End Date Taking? Authorizing Provider  amLODipine (NORVASC) 2.5 MG tablet  Take 2.5 mg by mouth at bedtime. 10/15/23   [provider]  busPIRone (BUSPAR) 10 MG tablet Take 10 mg by mouth 2 (two) times daily. 10/24/23   [provider]  FLUoxetine HCl 60 MG TABS Take 1 tablet by mouth daily. 09/24/23   [provider]  fluticasone (FLONASE) 50 MCG/ACT nasal spray Place into both nostrils. 08/19/22   [provider]  gabapentin (NEURONTIN) 100 MG capsule Take 100 mg by mouth 2 (two) times daily. 07/23/22   [provider]  gabapentin (NEURONTIN) 300 MG capsule Take 300 mg by mouth 3 (three) times daily. 07/23/22   [provider]  HYDROcodone -acetaminophen  (NORCO/VICODIN) 5-325 MG tablet Take 1 tablet by mouth 3 (three) times daily as needed. 09/29/23   [provider]  levocetirizine (XYZAL) 5 MG tablet Take 5 mg by mouth daily. 07/24/22   [provider]  lisinopril (ZESTRIL) 20 MG tablet Take 20 mg by mouth daily.    [provider]  omeprazole (PRILOSEC) 20 MG capsule Take 20 mg by mouth daily. 08/24/23   [provider]  sildenafil (REVATIO) 20 MG tablet Take 20 mg by mouth daily. 08/08/22   [provider]  SYMBICORT 160-4.5 MCG/ACT inhaler Inhale into the lungs. 07/14/22   [provider]  traMADol (ULTRAM) 50 MG tablet Take 50-100 mg by mouth 3 (three) times daily. 08/06/22   [provider]  traMADol-acetaminophen  (ULTRACET) 37.5-325 MG tablet Take 2 tablets by mouth 2 (two) times daily. 04/29/23   [provider]  VENTOLIN HFA 108 (90 Base) MCG/ACT inhaler Inhale into the lungs. 08/19/22   [provider]    Physical Exam: Vitals:   12/02/23 0103 12/02/23 0115 12/02/23 0130 12/02/23 0309  BP:   119/69 105/69  Pulse:  69 70 74  Resp:  18 15 13   Temp: (!) 97.1 F (36.2 C)     TempSrc: Oral     SpO2:  94% 94% 95%  Weight:      Height:       General: Ill-appearing. Awake and alert and oriented x3. Not in any acute distress.  HEENT: Right  forehead scalp laceration with periorbital hematoma.  PERRL.  Moist mucosal membranes.  C-collar in place Neck: Neck supple without lymphadenopathy. No carotid bruits. No masses palpated.  Cardiovascular: Regular rate with normal S1-S2 sounds. No murmurs, rubs or gallops auscultated. No JVD.  Respiratory: Clear breath sounds.  No accessory muscle use. Abdomen: Soft, nontender, nondistended. Active bowel sounds. No masses or hepatosplenomegaly  Skin: Right forehead scalp laceration and periorbital edema.Dry, warm to touch. Musculoskeletal:  2+ dorsalis pedis and radial pulses. Good ROM.  No contractures  Psychiatric: Intact judgment and insight.  Mood appropriate to current condition. Neurologic: No focal neurological deficits. Strength is 5/5 x 4.  CN II - XII grossly intact.  Data Reviewed: EKG personally reviewed showed normal sinus rhythm at a rate of 66 bpm  Assessment and Plan: Bilateral subdural hematoma (L > R ) Subarachnoid hemorrhage Nondisplaced C4 spinous process fracture Right forehead scalp laceration and hematoma Patient had a fall at home Urine drug screen was noted to be positive for benzodiazepine  which was not noted on his home meds and was not administered in the ED. Alcohol level was also high at 285.  These factors could have contributed to his fall considering history of recurrent syncopal episodes and dysautonomia. Trauma/neurosurgery was consulted by EDP and will follow-up with patient on arrival to [facility]  Esophagitis CT chest abdomen and pelvis was suggestive of esophagitis Continue IV Protonix 40 mg daily (consider transitioning to oral anticoagulant when patient resumes oral intake) Consider gastroenterology consult  Recurrent syncopal episodes Dysautonomia Continue telemetry and watch for arrhythmias Troponins  x 2 < 15  EKG personally reviewed showed normal sinus rhythm at a rate of 66 bpm Echocardiogram will be done to rule out significant aortic  stenosis or other outflow obstruction, and also to evaluate EF and to rule out segmental/Regional wall motion abnormalities.  Cardiology will be consulted and we shall await further recommendations  Lactic acidosis Lactic acid 4.5, IV hydration provided, continue to trend lactic acid Continue IV hydration  Alcohol intoxication Alcohol level 285, no sign of any withdrawal symptoms at this time Continue to monitor patient for withdrawal symptoms and consider starting patient on CIWA protocol  Essential hypertension BP meds will be held at this time due to soft BP   Advance Care Planning: Full code  Consults: Neurosurgery, trauma surgery by EDP.  Cardiology  Family Communication: Wife at bedside (all questions answered to satisfaction)  Severity of Illness: The appropriate patient status for this patient is INPATIENT. Inpatient status is judged to be reasonable and necessary in order to provide the required intensity of service to ensure the patient's safety. The patient's presenting symptoms, physical exam findings, and initial radiographic and laboratory data in the context of their chronic comorbidities is felt to place them at high risk for further clinical deterioration. Furthermore, it is not anticipated that the patient will be medically stable for discharge from the hospital within 2 midnights of admission.   * I certify that at the point of admission it is my clinical judgment that the patient will require inpatient hospital care spanning beyond 2 midnights from the point of admission due to high intensity of service, high risk for further deterioration and high frequency of surveillance required.*  Author: Rodnesha Elie, DO 12/02/2023 4:07 AM  For on call review [email or web address].

## 2023-12-02 NOTE — ED Notes (Signed)
 Patient arrives from [facility] via [facility]. Patient alert and oriented on arrival. Patient repetitively questions, consistent with received report.

## 2023-12-02 NOTE — Consult Note (Signed)
 Reason for Consult: Subdural hematoma Referring Physician: Dr. Theadore Ozell [name] is an 58 y.o. male.  HPI: Patient is a 58 year old individual who apparently had a ground-level fall and was found down by his wife.  He was confused and moaning and it is unclear how long he was on the ground he apparently fell down a step or 2 near the walk and there garage.  He was brought to the emergency room where CT scan demonstrated presence of a convexity subdural hematoma over the left frontal region and also evidence of interhemispheric subdural hematoma in the frontal region.  The patient has had some episodic falls secondary to some vagal issues where he develops a sudden low blood pressure.  This has been evaluated previously but is believed to be the cause of the current problem.  It is not appear that he has had any seizure-like activity there is no evidence of injury other than to the patient's forehead and some bruises on his left shoulder and arm.  Past Medical History:  Diagnosis Date   Anxiety    Diverticulitis    H/O left nephrectomy    Hypertension     Past Surgical History:  Procedure Laterality Date   APPENDECTOMY     COLONOSCOPY  2019   COLONOSCOPY WITH PROPOFOL  N/A 09/26/2022   Procedure: COLONOSCOPY WITH PROPOFOL ;  Surgeon: Cinderella Deatrice FALCON, MD;  [facility];  Service: Endoscopy;  Laterality: N/A;  9:15AM;ASA 2   POLYPECTOMY  09/26/2022   Procedure: POLYPECTOMY;  Surgeon: Cinderella Deatrice FALCON, MD;  Location: [facility];  Service: Endoscopy;;   sigmoid removal      Family History  Problem Relation Age of Onset   Hypertension Mother    Hypertension Father     Social History:  reports that he has been smoking cigarettes. He has never used smokeless tobacco. He reports that he does not currently use alcohol after a past usage of about 1.0 standard drink of alcohol per week. He reports that he does not use drugs.  Allergies:  Allergies  Allergen Reactions    Penicillins Rash   Hydrocodone  Itching    Medications: I have reviewed the patient's current medications.  Results for orders placed or performed during the hospital encounter of 12/01/23 (from the past 48 hours)  Comprehensive metabolic panel     Status: Abnormal   Collection Time: 12/01/23 10:31 PM  Result Value Ref Range   Sodium 132 (L) 135 - 145 mmol/L   Potassium 3.7 3.5 - 5.1 mmol/L   Chloride 95 (L) 98 - 111 mmol/L   CO2 20 (L) 22 - 32 mmol/L   Glucose, Bld 118 (H) 70 - 99 mg/dL    Comment: Glucose reference range applies only to samples taken after fasting for at least 8 hours.   BUN 10 6 - 20 mg/dL   Creatinine, Ser 9.02 0.61 - 1.24 mg/dL   Calcium 9.2 8.9 - 89.6 mg/dL   Total Protein 7.4 6.5 - 8.1 g/dL   Albumin 4.2 3.5 - 5.0 g/dL   AST 53 (H) 15 - 41 U/L   ALT 51 (H) 0 - 44 U/L   Alkaline Phosphatase 112 38 - 126 U/L   Total Bilirubin 0.4 0.0 - 1.2 mg/dL   GFR, Estimated >39 >39 mL/min    Comment: (NOTE) Calculated using the CKD-EPI Creatinine Equation (2021)    Anion gap 17 (H) 5 - 15    Comment: Performed at [facility], [address] [place], [place] [address]  CBC     Status: Abnormal   Collection Time: 12/01/23 10:31 PM  Result Value Ref Range   WBC 16.6 (H) 4.0 - 10.5 K/uL   RBC 4.68 4.22 - 5.81 MIL/uL   Hemoglobin 16.6 13.0 - 17.0 g/dL   HCT 52.8 60.9 - 47.9 %   MCV 100.6 (H) 80.0 - 100.0 fL   MCH 35.5 (H) 26.0 - 34.0 pg   MCHC 35.2 30.0 - 36.0 g/dL   RDW 87.4 88.4 - 84.4 %   Platelets 272 150 - 400 K/uL   nRBC 0.0 0.0 - 0.2 %    Comment: Performed at [facility], [address] [place], [place] [address]  Protime-INR     Status: None   Collection Time: 12/01/23 10:31 PM  Result Value Ref Range   Prothrombin Time 13.1 11.4 - 15.2 seconds   INR 0.9 0.8 - 1.2    Comment: (NOTE) INR goal varies based on device and disease states. Performed at [facility], [address] [place], [place] [address]   CK     Status: None   Collection Time:  12/01/23 10:31 PM  Result Value Ref Range   Total CK 86 49 - 397 U/L    Comment: Performed at [facility], [address], [place], [place] [address]  Troponin T, High Sensitivity     Status: None   Collection Time: 12/01/23 10:31 PM  Result Value Ref Range   Troponin T High Sensitivity <15 0 - 19 ng/L    Comment: (NOTE) Biotin concentrations > 1000 ng/mL falsely decrease TnT results.  Serial cardiac troponin measurements are suggested.  Refer to the Links section for chest pain algorithms and additional  guidance. Performed at [facility], [address] [place], [place] [address]   Ethanol     Status: Abnormal   Collection Time: 12/01/23 11:04 PM  Result Value Ref Range   Alcohol, Ethyl (B) 285 (H) <15 mg/dL    Comment: (NOTE) For medical purposes only. Performed at [facility], [address], [place], [place] [address]   Lactic acid, plasma     Status: Abnormal   Collection Time: 12/01/23 11:04 PM  Result Value Ref Range   Lactic Acid, Venous 4.5 (HH) 0.5 - 1.9 mmol/L    Comment: Critical Value, Read Back and verified with CSABRA Comer [id number], Viray,J Performed at [facility], [address] [place], [place] [address]   Troponin T, High Sensitivity     Status: None   Collection Time: 12/02/23  1:11 AM  Result Value Ref Range   Troponin T High Sensitivity <15 0 - 19 ng/L    Comment: (NOTE) Biotin concentrations > 1000 ng/mL falsely decrease TnT results.  Serial cardiac troponin measurements are suggested.  Refer to the Links section for chest pain algorithms and additional  guidance. Performed at [facility], [address] [place], [place] [address]   Urinalysis, Routine w reflex microscopic -Urine, Clean Catch     Status: Abnormal   Collection Time: 12/02/23  1:58 AM  Result Value Ref Range   Color, Urine STRAW (A) YELLOW   APPearance CLEAR CLEAR   Specific Gravity, Urine 1.004 (L) 1.005 - 1.030   pH 5.0 5.0 - 8.0   Glucose, UA NEGATIVE NEGATIVE mg/dL    Hgb urine dipstick SMALL (A) NEGATIVE   Bilirubin Urine NEGATIVE NEGATIVE   Ketones, ur NEGATIVE NEGATIVE mg/dL   Protein, ur NEGATIVE NEGATIVE mg/dL   Nitrite NEGATIVE  NEGATIVE   Leukocytes,Ua NEGATIVE NEGATIVE   RBC / HPF 0-5 0 - 5 RBC/hpf   WBC, UA 0-5 0 - 5 WBC/hpf   Bacteria, UA NONE SEEN NONE SEEN   Squamous Epithelial / HPF 0-5 0 - 5 /HPF    Comment: Performed at [facility], [address], [place], [place] [address]  Urine rapid drug screen (hosp performed)     Status: Abnormal   Collection Time: 12/02/23  1:58 AM  Result Value Ref Range   Opiates NEGATIVE NEGATIVE   Cocaine NEGATIVE NEGATIVE   Benzodiazepines POSITIVE (A) NEGATIVE   Amphetamines NEGATIVE NEGATIVE   Tetrahydrocannabinol NEGATIVE NEGATIVE   Barbiturates NEGATIVE NEGATIVE   Methadone Scn, Ur NEGATIVE NEGATIVE   Fentanyl NEGATIVE NEGATIVE    Comment: (NOTE) Drug screen is for Medical Purposes only. Positive results are preliminary only. If confirmation is needed, notify lab within 5 days.  Drug Class                 Cutoff (ng/mL) Amphetamine and metabolites 1000 Barbiturate and metabolites 200 Benzodiazepine              200 Opiates and metabolites     300 Cocaine and metabolites     300 THC                         50 Fentanyl                    5 Methadone                   300  Trazodone is metabolized in vivo to several metabolites,  including pharmacologically active m-CPP, which is excreted in the  urine.  Immunoassay screens for amphetamines and MDMA have potential  cross-reactivity with these compounds and may provide false positive  result.  Performed at [facility] [place] [facility], [address], [place], [place] [address]   HIV Antibody (routine testing w rflx)     Status: None   Collection Time: 12/02/23  4:27 AM  Result Value Ref Range   HIV Screen 4th Generation wRfx Non Reactive Non Reactive    Comment: Performed at [facility], [address] [place], [place] [address]  Comprehensive  metabolic panel     Status: Abnormal   Collection Time: 12/02/23  4:27 AM  Result Value Ref Range   Sodium 136 135 - 145 mmol/L   Potassium 4.3 3.5 - 5.1 mmol/L   Chloride 104 98 - 111 mmol/L   CO2 20 (L) 22 - 32 mmol/L   Glucose, Bld 94 70 - 99 mg/dL    Comment: Glucose reference range applies only to samples taken after fasting for at least 8 hours.   BUN 8 6 - 20 mg/dL   Creatinine, Ser 9.02 0.61 - 1.24 mg/dL   Calcium 8.0 (L) 8.9 - 10.3 mg/dL   Total Protein 6.0 (L) 6.5 - 8.1 g/dL   Albumin 3.1 (L) 3.5 - 5.0 g/dL   AST 45 (H) 15 - 41 U/L   ALT 40 0 - 44 U/L   Alkaline Phosphatase 71 38 - 126 U/L   Total Bilirubin 0.8 0.0 - 1.2 mg/dL   GFR, Estimated >39 >39 mL/min    Comment: (NOTE) Calculated using the CKD-EPI Creatinine Equation (2021)    Anion gap 12 5 - 15    Comment: Performed at [facility], [address], [place], [place] [address]  CBC     Status: Abnormal   Collection Time: 12/02/23  4:27 AM  Result Value Ref Range   WBC 17.5 (H) 4.0 - 10.5 K/uL   RBC 4.16 (L) 4.22 - 5.81 MIL/uL   Hemoglobin 14.6 13.0 - 17.0 g/dL   HCT 57.5 60.9 - 47.9 %   MCV 101.9 (H) 80.0 - 100.0 fL   MCH 35.1 (H) 26.0 - 34.0 pg   MCHC 34.4 30.0 - 36.0 g/dL   RDW 87.6 88.4 - 84.4 %   Platelets 263 150 - 400 K/uL   nRBC 0.0 0.0 - 0.2 %    Comment: Performed at [facility], [address], [place], [place] [address]  Magnesium     Status: None   Collection Time: 12/02/23  4:27 AM  Result Value Ref Range   Magnesium 1.9 1.7 - 2.4 mg/dL    Comment: Performed at [facility], [address] [place], [place] [address]  Phosphorus     Status: None   Collection Time: 12/02/23  4:27 AM  Result Value Ref Range   Phosphorus 3.9 2.5 - 4.6 mg/dL    Comment: Performed at [facility], [address], [place], [place] [address]  Lactic acid, plasma     Status: Abnormal   Collection Time: 12/02/23  5:25 AM  Result Value Ref Range   Lactic Acid, Venous 2.3 (HH) 0.5 - 1.9  mmol/L    Comment: CRITICAL RESULT CALLED TO, READ BACK BY AND VERIFIED WITH CHALICE CLICHE, RN 12/02/23 0557 MAULES Performed at [facility], [address], [place], [place] [address]     CT Cervical Spine Wo Contrast Result Date: 12/02/2023 EXAM: CT CERVICAL SPINE WITHOUT CONTRAST 12/01/2023 11:28:41 PM TECHNIQUE: CT of the cervical spine was performed without the administration of intravenous contrast. Multiplanar reformatted images are provided for review. Automated exposure control, iterative reconstruction, and/or weight based adjustment of the mA/kV was utilized to reduce the radiation dose to as low as reasonably achievable. COMPARISON: None available. CLINICAL HISTORY: Facial trauma, blunt. FINDINGS: CERVICAL SPINE: BONES AND ALIGNMENT: Mild reversal of cervical lordosis. Trace anterior listhesis C4 on C5. Trace anterior listhesis C5 on C6. Facet alignment is within normal limits. Questionable nondisplaced fracture involving the spinous process of C4, series 8 image 46. Vertebral body heights are maintained. DEGENERATIVE CHANGES: Moderate disk space narrowing C5-C6 and C6-C7. Multilevel hypertrophic facet degenerative changes. No high-grade bony canal stenosis. SOFT TISSUES: No prevertebral soft tissue swelling. IMPRESSION: 1. Questionable nondisplaced C4 spinous process fracture; correlate with point tenderness 2. Mild reversal of cervical lordosis. 3. Trace anterolisthesis at C4 on C5 and C5 on C6. 4. Moderate degenerative changes  at C5-C6 and C6-C7. Electronically signed by: Luke Bun MD 12/02/2023 12:16 AM EST RP Workstation: [id number]   CT Maxillofacial Wo Contrast Result Date: 12/02/2023 EXAM: CT OF THE FACE WITHOUT CONTRAST 12/01/2023 11:28:41 PM TECHNIQUE: CT of the face was performed without the administration of intravenous contrast. Multiplanar reformatted images are provided for review. Automated exposure control, iterative reconstruction, and/or weight based adjustment of  the mA/kV was utilized to reduce the radiation dose to as low as reasonably achievable. COMPARISON: None available. CLINICAL HISTORY: Facial trauma, blunt. FINDINGS: FACIAL BONES: Acute mildly depressed nasal bone fracture. No definitive sinus wall fracture. No mandibular dislocation. No suspicious bone lesion. ORBITS: Globes are intact. No acute traumatic injury. No inflammatory change. SINUSES AND MASTOIDS: Mild mucosal thickening in the sinuses. Small fluid level in the right  maxillary sinus with chronic-appearing wall thickening. SOFT TISSUES: Moderate right forehead laceration and hematoma. Moderate-to-marked right periorbital hematoma. Soft tissue swelling over the nasal area. IMPRESSION: 1. Acute mildly depressed nasal bone fracture. 2. Moderate right forehead laceration and hematoma with moderate-to-marked right periorbital hematoma . 3. Right maxillary sinus fluid level with chronic-appearing wall thickening, without definitive sinus wall fracture. Electronically signed by: Luke Bun MD 12/02/2023 12:12 AM EST RP Workstation: [id number]   CT Head Wo Contrast Result Date: 12/02/2023 EXAM: CT HEAD WITHOUT CONTRAST 12/01/2023 11:28:41 PM TECHNIQUE: CT of the head was performed without the administration of intravenous contrast. Automated exposure control, iterative reconstruction, and/or weight based adjustment of the mA/kV was utilized to reduce the radiation dose to as low as reasonably achievable. COMPARISON: None available. CLINICAL HISTORY: unwitnessed fall, head laceration FINDINGS: BRAIN AND VENTRICLES: The ventricles are nonenlarged. No evidence of acute infarct. Acute bilateral left greater than right convexity subdural hematomas with hemorrhage along the right greater than left falx. Maximum thickness of left subdural hematoma measures 9 mm. Right convexity subdural hematoma measures about 4 mm maximum thickness. Globular extra-axial hemorrhage at the right vertex, possibly focal subdural or  subarachnoid, measuring 8 mm maximum thickness. Small foci of subarachnoid hemorrhage at the right frontal vertex. Possible trace 1 to 2 mm shift to the right. ORBITS: No acute abnormality. SINUSES: Moderate mucosal thickening in the sinuses. SOFT TISSUES AND SKULL: Moderate right forehead scalp laceration and hematoma. No skull fracture. Traumatic Brain Injury Risk Stratification ----- Skull Fracture: No (Low - mBIG 1) Subdural Hematoma (SDH): Left convexity: 9 mm (High - mBIG 3). Right convexity: 4 mm (mBIG 1). Globular extra-axial hemorrhage at right vertex (if subdural): 8 mm (High - mBIG 3). Overall: High - mBIG 3. Subarachnoid Hemorrhage [facility]): Small foci at right frontal vertex (Trace - up to 2 sulci, Low - mBIG 1). Globular extra-axial hemorrhage at right vertex (if subarachnoid): 8 mm (localized, unilateral, Moderate - mBIG 2). Overall: Moderate - mBIG 2. Epidural Hematoma (EDH): No (Low - mBIG 1) Cerebral contusion, intra-axial, intraparenchymal Hemorrhage (IPH): No (Low - mBIG 1) Intraventricular Hemorrhage (IVH): No (Low - mBIG 1) Midline Shift > 1mm or Edema/effacement of sulci/vents: Possible trace 1 to 2 mm shift to the right (High - mBIG 3) IMPRESSION: 1. Acute bilateral convexity subdural hematomas, left greater than right, with associated interhemispheric/ para falcine hemorrhage and possible trace 1-2 mm rightward shift. 2. Small subarachnoid hemorrhage at the right frontal vertex. 3. Globular extra-axial collections at the right vertex, possibly focal subdural or subarachnoid blood, measuring up to 8 mm. 4. Moderate right forehead scalp laceration and hematoma. Critical finding(s) were communicated by phone by Dr. Bun to  Dr. Theadore on 12/02/2023  at  11:58 PM. Electronically signed by: Luke Bun MD 12/02/2023 12:08 AM EST RP Workstation: [id number]   DG Pelvis Portable Result Date: 12/01/2023 EXAM: 1 or 2 VIEW(S) XRAY OF THE PELVIS 12/01/2023 11:45:00 PM COMPARISON: None available.  CLINICAL HISTORY: Trauma Trauma FINDINGS: BONES AND JOINTS: No acute fracture. No focal osseous lesion. No joint dislocation. SOFT TISSUES: The soft tissues are unremarkable. IMPRESSION: 1. No significant abnormality. Electronically signed by: Greig Pique MD 12/01/2023 11:56 PM EST RP Workstation: [id number]   DG Chest Port 1 View Result Date: 12/01/2023 EXAM: 1 VIEW(S) XRAY OF THE CHEST 12/01/2023 11:45:00 PM COMPARISON: Chest x-ray dated 08/18/2004. CLINICAL HISTORY: Trauma. FINDINGS: LUNGS AND PLEURA: No focal pulmonary opacity. No pulmonary edema. No pleural effusion. No pneumothorax. HEART AND MEDIASTINUM: No acute abnormality of  the cardiac and mediastinal silhouettes. BONES AND SOFT TISSUES: No acute osseous abnormality. IMPRESSION: 1. No acute process. Electronically signed by: Greig Pique MD 12/01/2023 11:55 PM EST RP Workstation: [id number]   CT CHEST ABDOMEN PELVIS WO CONTRAST Result Date: 12/01/2023 EXAM: CT CHEST, ABDOMEN AND PELVIS WITHOUT CONTRAST 12/01/2023 11:28:41 PM TECHNIQUE: CT of the chest, abdomen and pelvis was performed without the administration of intravenous contrast. Multiplanar reformatted images are provided for review. Automated exposure control, iterative reconstruction, and/or weight based adjustment of the mA/kV was utilized to reduce the radiation dose to as low as reasonably achievable. COMPARISON: CT chest abdomen and pelvis 12/27/2012. CLINICAL HISTORY: Polytrauma, blunt. FINDINGS: CHEST: MEDIASTINUM AND LYMPH NODES: Heart and pericardium are unremarkable. Fair atherosclerotic calcifications of the coronary arteries. There is diffuse esophageal wall thickening with mild surrounding inflammatory. There are small paraesophageal lymph nodes. There is a mildly hyperdense fluid in the posterior right mediastinum measuring 1.4 x 1.2 x 2.8 cm. The central airways are clear. No mediastinal, hilar or axillary lymphadenopathy. LUNGS AND PLEURA: Mild emphysema present. Minimal  patchy ground glass opacities in the left upper lobe. Lung base nodules in the lower left hemithorax measuring up to 2.5 cm appear unchanged. This was previously characterized as splenosis. No focal consolidation or pulmonary edema. No pleural effusion or pneumothorax. ABDOMEN AND PELVIS: LIVER: The liver is unremarkable. GALLBLADDER AND BILE DUCTS: Gallbladder is unremarkable. No biliary ductal dilatation. SPLEEN: The spleen had been surgically removed. PANCREAS: No acute abnormality. ADRENAL GLANDS: No acute abnormality. KIDNEYS, URETERS AND BLADDER: The left kidney had been surgically removed. No stones in the right kidney or ureters. No hydronephrosis of the right kidney. No perinephric or periureteral stranding. Urinary bladder is unremarkable. GI AND BOWEL: Stomach demonstrates no acute abnormality. Appendix is not visualized. There is no bowel obstruction. REPRODUCTIVE ORGANS: No acute abnormality. PERITONEUM AND RETROPERITONEUM: No ascites. No free air. VASCULATURE: Aorta is normal in caliber. Atherosclerotic calcifications of the aorta. ABDOMINAL AND PELVIS LYMPH NODES: No lymphadenopathy. BONES AND SOFT TISSUES: Chronic rib fractures bilaterally. No acute fractures are identified. No focal soft tissue abnormality. IMPRESSION: 1. Diffuse esophageal wall thickening with mild surrounding inflammatory changes and small paraesophageal lymph nodes. Correlate clinically for esophagitis. GI consultation follow-up recommended. 2. Mildly hyperdense fluid in the posterior right mediastinum measuring 1.4 x 1.2 x 2.8 cm. This is indeterminate and may be related to fluid collection secondary to trauma or secondary to esophagitis. Contained esophageal perforation less likely, but cannot be excluded given location and appearance. Electronically signed by: Greig Pique MD 12/01/2023 11:41 PM EST RP Workstation: [id number]    Review of Systems  Unable to perform ROS: Acuity of condition   Blood pressure 110/69, pulse  65, temperature (!) 97.3 F (36.3 C), temperature source Oral, resp. rate 13, height 6' 2 (1.88 m), weight 107.5 kg, SpO2 99%. Physical Exam Constitutional:      Appearance: He is normal weight.  HENT:     Head:     Comments: Sewn laceration over the right frontal orbital region with moderate edema and contusion of the galea on the right.    Right Ear: Tympanic membrane, ear canal and external ear normal.     Left Ear: Tympanic membrane, ear canal and external ear normal.     Nose: Nose normal.     Mouth/Throat:     Mouth: Mucous membranes are moist.     Pharynx: Oropharynx is clear.  Eyes:     Extraocular Movements: Extraocular movements intact.  Conjunctiva/sclera: Conjunctivae normal.     Pupils: Pupils are equal, round, and reactive to light.  Cardiovascular:     Rate and Rhythm: Normal rate and regular rhythm.     Pulses: Normal pulses.     Heart sounds: Normal heart sounds.  Pulmonary:     Effort: Pulmonary effort is normal.     Breath sounds: Normal breath sounds.  Abdominal:     General: Abdomen is flat. Bowel sounds are normal.     Palpations: Abdomen is soft.  Skin:    General: Skin is warm and dry.     Capillary Refill: Capillary refill takes less than 2 seconds.  Neurological:     Mental Status: He is alert.     Comments: Patient is alert and oriented.  Answers questions appropriately.   rather talkative.  No evidence of a drift.  Motor function is good in the upper and lower extremities.  Cranial nerve examination is within the limits of normal  Psychiatric:        Mood and Affect: Mood normal.        Behavior: Behavior normal.        Thought Content: Thought content normal.        Judgment: Judgment normal.     Assessment/Plan: Subdural hematoma left frontal region with minimal shift minimal mass effect interhemispheric subdural hematomas also present.  Plan would be to observe the patient and repeat the scan in about 12 hours or sometime this afternoon.   If the scan is unchanging then conservative treatment would be in order.  I did discuss the importance with the patient that he refrain from any use of alcohol as this would definitely lower his seizure threshold and could cause further problems for him.  He may be mobilized with the use of therapies initially.  Further workup regarding his syncopal episodes to be undertaken by the medicine service.  I will likely follow the patient up in about 2 weeks with a repeat CT scan and if the current scan remained stable and the clinical status is unchanging.  [name] 12/02/2023, 8:56 AM

## 2023-12-02 NOTE — ED Notes (Signed)
 EMS c-collar replaced with [place].   Pt sts history of frequent syncopal episodes and irregular blood pressures.   Says he does not frequently drink but went to neighbors and had 1 beverage. Pt then found in the outdoor carport after being down for unknown time. Hypothermic (94 degrees) on arrival with warming intervention implemented.  Pt declines any anticoagulants.   Frequent repetition and constantly introducing self to staff. Bleeding controlled on arrival. Approximate 5-6cm laceration to left forehead that required 12 sutures.

## 2023-12-02 NOTE — Plan of Care (Signed)

## 2023-12-02 NOTE — Evaluation (Signed)
 Physical Therapy Evaluation Patient Details Name: [name] MRN: [id number] DOB: 1965-07-30 Today's Date: 12/02/2023  History of Present Illness  Patient is a 58 y/o male admitted 12/01/23 found down on driveway by wife.  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  CT c-spine positive for nondisplaced C4 spinous process fx.  PMH HTN, chronic back pain, GERD, h/o L nephrectomy and recent work up for dysautonomia and orthostatic hypotension.  Clinical Impression  Patient presents with decreased mobility due to pain, limited activity tolerance with nausea, dizziness (with L horizontal canal BPPV), decreased balance and history of falls.  Today orthostatic BP testing WNL (as noted below) though pt relates received IV bolus in ED.  Patient educated on symptoms of mild TBI/concussion and on BPPV and treated x 1 with Gufani repositioning.  Patient will benefit from continued skilled PT in the acute setting and possibly from outpatient PT follow up at d/c.  Orthostatic VS for the past 24 hrs (Last 3 readings):  BP- Lying Pulse- Lying BP- Sitting Pulse- Sitting BP- Standing at 0 minutes Pulse- Standing at 0 minutes BP- Standing at 3 minutes Pulse- Standing at 3 minutes  12/02/23 1649 (!) 139/99 109 (!) 151/91 107 (!) 141/95 87 (!) 159/94 96           If plan is discharge home, recommend the following: Help with stairs or ramp for entrance;Assist for transportation;Assistance with cooking/housework   Can travel by private vehicle        Equipment Recommendations None recommended by PT  Recommendations for Other Services       Functional Status Assessment Patient has had a recent decline in their functional status and demonstrates the ability to make significant improvements in function in a reasonable and predictable amount of time.      Precautions / Restrictions Precautions Precautions: Fall Precaution/Restrictions Comments: watch BP      Mobility  Bed Mobility Overal bed mobility: Needs Assistance Bed Mobility: Supine to Sit, Sit to Supine     Supine to sit: Contact guard, HOB elevated Sit to supine: Supervision   General bed mobility comments: increased time, heavy UE support at times for coming up after tx for BPPV and noted R nystagmus upon returning to supine    Transfers Overall transfer level: Needs assistance Equipment used: None Transfers: Sit to/from Stand Sit to Stand: Contact guard assist           General transfer comment: for balance, safety    Ambulation/Gait Ambulation/Gait assistance: Supervision, Contact guard assist Gait Distance (Feet): 200 Feet Assistive device: None Gait Pattern/deviations: Wide base of support, Drifts right/left, Step-through pattern, Decreased stride length       General Gait Details: mild instability though no LOB wide based gait  Stairs Stairs: Yes Stairs assistance: Contact guard assist Stair Management: Alternating pattern, Forwards, Two rails Number of Stairs: 2 General stair comments: assist for safety  Wheelchair Mobility     Tilt Bed    Modified Rankin (Stroke Patients Only)       Balance Overall balance assessment: Needs assistance   Sitting balance-Leahy Scale: Good       Standing balance-Leahy Scale: Good                               Pertinent Vitals/Pain Pain Assessment Pain Assessment: Faces  Faces Pain Scale: Hurts little more Pain Location: R head pressure Pain Descriptors / Indicators: Tender, Pressure Pain Intervention(s): Monitored during session, Premedicated before session    Home Living Family/patient expects to be discharged to:: Private residence Living Arrangements: Spouse/significant other Available Help at Discharge: Family Type of Home: House Home Access: Stairs to enter Entrance  Stairs-Rails: Right Entrance Stairs-Number of Steps: 2   Home Layout: One level Home Equipment: None;Shower seat      Prior Function Prior Level of Function : Independent/Modified Independent             Mobility Comments: working driving delivery truck       Extremity/Trunk Assessment        Lower Extremity Assessment Lower Extremity Assessment: Overall WFL for tasks assessed    Cervical / Trunk Assessment Cervical / Trunk Assessment: Other exceptions Cervical / Trunk Exceptions: C4 nondiscplaced spinous process fx  Communication   Communication Communication: No apparent difficulties    Cognition Arousal: Alert Behavior During Therapy: WFL for tasks assessed/performed                             Following commands: Intact       Cueing Cueing Techniques: Verbal cues     General Comments General comments (skin integrity, edema, etc.): R eye partially swollen and unable to open fully, pt relates can see though limited and R eye dominant.  Upon return to supine noted R beating nystagmus and pt related spinning; R supine head roll negative for nystagmus, L positive for intense geotropic nystagmus lasting ~15 seconds; RN had medicated for nausea prior to mobility due to pt relating nausea after pain meds given.  Briefly reviewed handout on mild TBI/concussion and issued to pt/wife    Exercises Other Exercises Other Exercises: Completed Gufani repositioning maneuver for L horizontal canal canalithiasis BPPV   Assessment/Plan    PT Assessment Patient needs continued PT services  PT Problem List Decreased activity tolerance;Decreased balance;Decreased mobility;Pain;Decreased safety awareness;Other (comment) (dizziness)       PT Treatment Interventions DME instruction;Gait training;Stair training;Patient/family education;Functional mobility training;Therapeutic activities;Therapeutic exercise;Canalith reposition;Balance training    PT Goals (Current  goals can be found in the Care Plan section)  Acute Rehab PT Goals Patient Stated Goal: return home PT Goal Formulation: With patient/family Time For Goal Achievement: 12/09/23 Potential to Achieve Goals: Good    Frequency Min 3X/week     Co-evaluation               AM-PAC PT 6 Clicks Mobility  Outcome Measure Help needed turning from your back to your side while in a flat bed without using bedrails?: A Little Help needed moving from lying on your back to sitting on the side of a flat bed without using bedrails?: A Little Help needed moving to and from a bed to a chair (including a wheelchair)?: A Little Help needed standing up from a chair using your arms (e.g., wheelchair or bedside chair)?: A Little Help needed to walk in hospital room?: A Little Help needed climbing 3-5 steps with a railing? : A Little 6 Click Score: 18    End of Session   Activity Tolerance: Patient limited by fatigue Patient left: in bed;with call bell/phone within reach;with family/visitor present   PT Visit Diagnosis: Other abnormalities of gait and mobility (R26.89);History of falling (Z91.81);Other symptoms and signs involving the nervous system (M70.101)    Time: 8369-8284 PT Time Calculation (min) (ACUTE  ONLY): 45 min   Charges:   PT Evaluation $PT Eval Moderate Complexity: 1 Mod PT Treatments $Gait Training: 8-22 mins $Canalith Rep Proc: 8-22 mins PT General Charges $$ ACUTE PT VISIT: 1 Visit         Micheline Portal, PT Acute Rehabilitation Services [phone] 12/02/2023   Montie Portal 12/02/2023, 6:38 PM

## 2023-12-02 NOTE — Progress Notes (Addendum)
 PROGRESS NOTE  [name]  DOB: [birth year]  PCP: Maree Isles, MD FMW:[id number]  DOA: 12/01/2023  LOS: 1 day  Hospital Day: 2  Subjective: Patient was seen and examined this morning. Pleasant middle-aged Caucasian male. Lying down in bed.  On cervical collar.  Wife at bedside Mental status improved back to normal. History reviewed and detailed as below  Brief narrative: [name] is a 58 y.o. male with PMH significant for HTN, chronic back pain, GERD, anxiety, h/o left nephrectomy, recurrent syncope, falls secondary to dysautonomia and orthostatic hypotension. 11/4, patient was brought to the ED at [facility] by EMS after an unwitnessed fall.  Per report, patient had 1 drink at his neighbors house, walked back to his garage and fell.  He was later found by his wife on the floor covered in blood, called EMS. EMS noted right forehead scalp laceration, hematoma. EMS put him on a c-collar and brought to ED.  On arrival, patient was hypothermic at 94 degrees, warming intervention implemented Other vital signs were normal. On initial Triage, he was frequently repeating himself.  Noted to have approximately 5 to 6 mm laceration to left forehead that required sutures. Initial labs with WC count 16.6, hemoglobin 16.6, sodium 132, renal function normal, lactic acid 4.5, CK troponin normal, blood alcohol level elevated to 85 UDS positive for benzodiazepine Urinalysis normal Chest x-ray, x-ray pelvis were normal CT head showed 1. Acute bilateral convexity subdural hematomas, L>R, with associated interhemispheric/ para falcine hemorrhage and possible trace 1-2 mm rightward shift. 2. Small subarachnoid hemorrhage at the right frontal vertex. 3. Globular extra-axial collections at the right vertex, possibly focal subdural or subarachnoid blood, measuring up to 8 mm. 4. Moderate right forehead scalp laceration and hematoma.  CT cervical spine showed - Questionable nondisplaced C4  spinous process fracture - Trace anterolisthesis at C4 on C5 and C5 and C6 - Moderate degenerative changes at C5-C6 and C6-C7  CT scan maxillofacial showed - acute mildly depressed nasal bone fracture.   - Moderate right forehead laceration and hematoma with moderate-to-marked right periorbital hematoma.   CT chest, abdomen and pelvis did not show any traumatic injury but showed diffuse esophageal wall thickening with mild surrounding inflammation descending small paraesophageal lymph nodes suggestive of esophagitis  Patient was given IV pain control, IV hydration Trauma surgeon on-call Dr. Lyndel was contacted who recommended admitting patient to TRH service at [facility] per   Neurosurgery (Dr. Colon) was consulted and recommended admitting patient to [facility] and to repeat CTA head in 12 hours.  Because of inpatient bed at [facility] not being available, patient was transferred from [facility] to [facility] Admitted to [facility]  Assessment and plan: Traumatic head, neck, facial injury -Bilateral subdural hematoma (L > R ) -Subarachnoid hemorrhage -Nondisplaced C4 spinous process fracture -Right forehead scalp laceration and hematoma Secondary to fall at home Has h/o recurrent syncope and fall secondary to dysautonomia and orthostatic hypotension.  Was also under the influence of alcohol at the time.  UDS positive for benzodiazepine.  Patient uses Xanax as needed at home Trauma surgery, neurosurgery following Has cervical collar in place On clear liquid diet and IV hydration with LR at 100 mL/h Repeat CT head pending today Regarding the CT cervical spine finding, I discussed with neurosurgeon Dr. Colon.  He stated that on his exam, patient did not have any cervical tenderness.  Patient can come off cervical collar and participate with therapy.  Pain regimen --- Scheduled:  Tylenol  1 g 4 times daily, Robaxin oral 1000 mg every 8 hours --- PRN: IV Dilaudid  0.5 mg every 4 hours,  oral oxycodone  5 to 10 mg every 4 hours   H/o recurrent syncope, fall Dysautonomia/orthostatic hypotension Has been struggling with this for several years.  Because of episodic rise in blood pressure, he has been taking amlodipine and lisinopril as well but at times his blood pressure dips down significantly and he passes out.  He follows with PCP and cardiologist.   Continue to monitor orthostatic vital signs in the hospital Continue telemetry monitoring Troponin not elevated Echo pending PTA meds- amlodipine 5 mg nightly, lisinopril 20 mg daily,  Home med list also shows sildenafil as needed for ED.  Would suggest to not use it given recurrent hypotension and syncope  Leukocytosis  lactic acidosis Leukocytosis likely due to acute distress.  Lactic acidosis likely due to alcohol use. No evidence of infection. Repeat labs in a.m. Recent Labs  Lab 12/01/23 2231 12/01/23 2304 12/02/23 0427 12/02/23 0525 12/02/23 0818  WBC 16.6*  --  17.5*  --   --   LATICACIDVEN  --  4.5*  --  2.3* 2.0*   Esophagitis CT chest, abdomen and pelvis did not show any traumatic injury but showed diffuse esophageal wall thickening with mild surrounding inflammation descending small paraesophageal lymph nodes suggestive of esophagitis. Patient endorses longstanding history of reflux and takes Prilosec intermittently. Reports colonoscopy in the past with GI attending but no endoscopy.  Would benefit from GI evaluation as an outpatient  H/o diverticulosis S/p colectomy, colostomy reversal Last BM prior to fall yesterday  Alcohol intoxication Alcohol level was 285.  Patient and wife state that he very occasionally drinks.  Yesterday he gave company to his neighbor and just took 1 drink just prior to fall.  No sign of any withdrawal symptoms at this time   Hyponatremia Mild.  Improved Recent Labs  Lab 12/01/23 2231 12/02/23 0427  NA 132* 136   Chronic back pain  anxiety/depression PTA meds- BuSpar  10 mg mg/15 mg mg -am/pm, fluoxetine 60 mg daily, Seroquel 50 mg nightly, Xanax 1 mg daily as needed, Neurontin nightly as needed I would keep those on hold for now.  COPD Bronchodilators.  h/o left nephrectomy After trauma at the age of 78   Mobility: PT eval  PT Orders: Active   PT Follow up Rec:    Goals of care   Code Status: Full Code     DVT prophylaxis:  SCDs Start: 12/02/23 0410   Antimicrobials: None Fluid: LR at 100 mL/h Consultants: Trauma surgery, neurosurgery Family Communication: Wife at bedside  Status: Inpatient Level of care:  Progressive   Patient is from: Home Needs to continue in-hospital care: Ongoing workup and management Anticipated d/c to: Pending clinical course   Diet:  Diet Order             Diet clear liquid Room service appropriate? Yes; Fluid consistency: Thin  Diet effective now                   Scheduled Meds:  acetaminophen   1,000 mg Oral Q6H   methocarbamol  1,000 mg Oral Q8H   pantoprazole (PROTONIX) IV  40 mg Intravenous Q24H   [START ON 12/03/2023] pneumococcal 20-valent conjugate vaccine  0.5 mL Intramuscular Tomorrow-1000   polyethylene glycol  17 g Oral Daily   Tdap  0.5 mL Intramuscular Once    PRN meds: HYDROmorphone  (DILAUDID ) injection, ondansetron  **OR** ondansetron  (  ZOFRAN ) IV, oxyCODONE    Infusions:   lactated ringers  100 mL/hr at 12/02/23 0512    Antimicrobials: Anti-infectives (From admission, onward)    None       Objective: Vitals:   12/02/23 1114 12/02/23 1215  BP:  (!) 148/75  Pulse:  80  Resp:  15  Temp: 98.7 F (37.1 C)   SpO2:  96%    Intake/Output Summary (Last 24 hours) at 12/02/2023 1311 Last data filed at 12/02/2023 0512 Gross per 24 hour  Intake 45.88 ml  Output 1000 ml  Net -954.12 ml   Filed Weights   12/01/23 2231  Weight: 107.5 kg   Weight change:  Body mass index is 30.43 kg/m.   Physical Exam: General exam: Pleasant, middle-aged Caucasian male.  Not in  pain Skin: No rashes, lesions or ulcers. HEENT: Sutured wound on the right anterior forehead.  Dried blood on the face Lungs: Clear to auscultation bilaterally,  CVS: S1, S2, no murmur,   GI/Abd: Soft, nontender, nondistended, bowel sound present, old scar from previous surgery CNS: Alert, awake, oriented x 3 Psychiatry: Mood appropriate Extremities: No pedal edema, no calf tenderness  Data Review: I have personally reviewed the laboratory data and studies available.  F/u labs ordered Unresulted Labs (From admission, onward)     Start     Ordered   12/03/23 0500  Basic metabolic panel with GFR  Tomorrow morning,   R        12/02/23 0856   12/03/23 0500  CBC with Differential/Platelet  Tomorrow morning,   R        12/02/23 0856   12/03/23 0500  Lactic acid, plasma  (Lactic Acid)  Tomorrow morning,   R        12/02/23 0856            Signed, Chapman Rota, MD [facility] 12/02/2023

## 2023-12-02 NOTE — Progress Notes (Signed)
 Echocardiogram 2D Echocardiogram has been performed.  [name] [name] RDCS 12/02/2023, 12:18 PM

## 2023-12-02 NOTE — ED Provider Notes (Signed)
 [facility]-EMERGENCY DEPT [facility] Emergency Department Provider Note MRN:  [id number]  Arrival date & time: 12/02/23     Chief Complaint   Fall   History of Present Illness   [name] is a 58 y.o. year-old male with a history of hypertension presenting to the ED with chief complaint of fall.  Patient has been having frequent syncopal episodes and falls recently.  Doctors tell him he has dysautonomia and orthostatic hypotension.  He was found on the ground in the garage covered in blood by wife, rushed here for evaluation.   Review of Systems  A thorough review of systems was obtained and all systems are negative except as noted in the HPI and PMH.   Patient's Health History    Past Medical History:  Diagnosis Date   Anxiety    Diverticulitis    H/O left nephrectomy    Hypertension     Past Surgical History:  Procedure Laterality Date   APPENDECTOMY     COLONOSCOPY  2019   COLONOSCOPY WITH PROPOFOL  N/A 09/26/2022   Procedure: COLONOSCOPY WITH PROPOFOL ;  Surgeon: Cinderella Deatrice FALCON, MD;  Location: [facility] ENDO SUITE;  Service: Endoscopy;  Laterality: N/A;  9:15AM;ASA 2   POLYPECTOMY  09/26/2022   Procedure: POLYPECTOMY;  Surgeon: Cinderella Deatrice FALCON, MD;  Location: [facility] ENDO SUITE;  Service: Endoscopy;;   sigmoid removal      Family History  Problem Relation Age of Onset   Hypertension Mother    Hypertension Father     Social History   Socioeconomic History   Marital status: Single    Spouse name: Not on file   Number of children: Not on file   Years of education: Not on file   Highest education level: Not on file  Occupational History   Not on file  Tobacco Use   Smoking status: Some Days    Current packs/day: 0.50    Types: Cigarettes   Smokeless tobacco: Never  Vaping Use   Vaping status: Never Used  Substance and Sexual Activity   Alcohol use: Not Currently    Alcohol/week: 1.0 standard drink of alcohol    Types: 1 Cans of beer per week    Comment:  occasionally   Drug use: Never   Sexual activity: Not Currently  Other Topics Concern   Not on file  Social History Narrative   Not on file   Social Drivers of Health   Financial Resource Strain: Not on file  Food Insecurity: No Food Insecurity (08/26/2022)   Hunger Vital Sign    Worried About Running Out of Food in the Last Year: Never true    Ran Out of Food in the Last Year: Never true  Transportation Needs: No Transportation Needs (08/26/2022)   PRAPARE - Administrator, Civil Service (Medical): No    Lack of Transportation (Non-Medical): No  Physical Activity: Not on file  Stress: Not on file  Social Connections: Not on file  Intimate Partner Violence: Not At Risk (08/26/2022)   Humiliation, Afraid, Rape, and Kick questionnaire    Fear of Current or Ex-Partner: No    Emotionally Abused: No    Physically Abused: No    Sexually Abused: No     Physical Exam   Vitals:   12/02/23 0103 12/02/23 0115  BP:    Pulse:  69  Resp:  18  Temp: (!) 97.1 F (36.2 C)   SpO2:  94%    CONSTITUTIONAL: Ill-appearing, NAD NEURO/PSYCH:  Alert and oriented x 3, normal and symmetric strength and sensation, normal coordination, normal speech, occasional perseveration EYES:  eyes equal and reactive ENT/NECK:  no LAD, no JVD CARDIO: Regular rate, well-perfused, normal S1 and S2 PULM:  CTAB no wheezing or rhonchi GI/GU:  non-distended, non-tender MSK/SPINE:  No gross deformities, no edema SKIN:  no rash, laceration to the right brow   *Additional and/or pertinent findings included in MDM below  Diagnostic and Interventional Summary    EKG Interpretation Date/Time:  December 01, 2023 at 23:43:48 Ventricular Rate:   66 PR Interval:   139 QRS Duration:   99 QT Interval:   410 QTC Calculation:  430 R Axis:      Text Interpretation: Sinus rhythm       Labs Reviewed  COMPREHENSIVE METABOLIC PANEL WITH GFR - Abnormal; Notable for the following components:      Result  Value   Sodium 132 (*)    Chloride 95 (*)    CO2 20 (*)    Glucose, Bld 118 (*)    AST 53 (*)    ALT 51 (*)    Anion gap 17 (*)    All other components within normal limits  CBC - Abnormal; Notable for the following components:   WBC 16.6 (*)    MCV 100.6 (*)    MCH 35.5 (*)    All other components within normal limits  ETHANOL - Abnormal; Notable for the following components:   Alcohol, Ethyl (B) 285 (*)    All other components within normal limits  LACTIC ACID, PLASMA - Abnormal; Notable for the following components:   Lactic Acid, Venous 4.5 (*)    All other components within normal limits  PROTIME-INR  CK  URINALYSIS, ROUTINE W REFLEX MICROSCOPIC  URINE DRUG SCREEN  TROPONIN T, HIGH SENSITIVITY  TROPONIN T, HIGH SENSITIVITY    DG Chest Port 1 View  Final Result    DG Pelvis Portable  Final Result    CT Head Wo Contrast  Final Result    CT Cervical Spine Wo Contrast  Final Result    CT Maxillofacial Wo Contrast  Final Result    CT CHEST ABDOMEN PELVIS WO CONTRAST  Final Result      Medications  Tdap (ADACEL) injection 0.5 mL (0.5 mLs Intramuscular Not Given 12/02/23 0024)  sodium chloride 0.9 % bolus 1,000 mL (0 mLs Intravenous Stopped 12/02/23 0120)  sodium chloride 0.9 % bolus 1,000 mL (1,000 mLs Intravenous New Bag/Given 12/02/23 0025)  lidocaine  (PF) (XYLOCAINE ) 1 % injection 10 mL (10 mLs Infiltration Given 12/02/23 0024)  HYDROmorphone  (DILAUDID ) injection 1 mg (1 mg Intravenous Given 12/02/23 0047)     Procedures  /  Critical Care .Critical Care  Performed by: Theadore Ozell HERO, MD Authorized by: Theadore Ozell HERO, MD   Critical care provider statement:    Critical care time (minutes):  80   Critical care was necessary to treat or prevent imminent or life-threatening deterioration of the following conditions:  Trauma   Critical care was time spent personally by me on the following activities:  Development of treatment plan with patient or surrogate,  discussions with consultants, evaluation of patient's response to treatment, examination of patient, ordering and review of laboratory studies, ordering and review of radiographic studies, ordering and performing treatments and interventions, pulse oximetry, re-evaluation of patient's condition and review of old charts .Laceration Repair  Date/Time: 12/03/2023 12:39 AM  Performed by: Theadore Ozell HERO, MD Authorized by: Theadore Ozell HERO,  MD   Consent:    Consent obtained:  Verbal   Consent given by:  Patient and spouse   Risks, benefits, and alternatives were discussed: yes     Risks discussed:  Infection, need for additional repair, nerve damage, poor wound healing, poor cosmetic result, pain, retained foreign body, tendon damage and vascular damage Universal protocol:    Procedure explained and questions answered to patient or proxy's satisfaction: yes     Immediately prior to procedure, a time out was called: yes     Patient identity confirmed:  Hospital-assigned identification number Anesthesia:    Anesthesia method:  Local infiltration   Local anesthetic:  Lidocaine  1% w/o epi Laceration details:    Location:  Face   Face location:  R eyebrow   Length (cm):  6   Depth (mm):  3 Pre-procedure details:    Preparation:  Patient was prepped and draped in usual sterile fashion Exploration:    Limited defect created (wound extended): no     Hemostasis achieved with:  Direct pressure   Imaging outcome: foreign body not noted     Wound exploration: wound explored through full range of motion and entire depth of wound visualized     Contaminated: no   Treatment:    Area cleansed with:  Soap and water   Amount of cleaning:  Extensive   Debridement:  Minimal Skin repair:    Repair method:  Sutures   Suture size:  5-0   Suture material:  Prolene   Suture technique:  Simple interrupted   Number of sutures:  12 Approximation:    Approximation:  Close Repair type:    Repair type:   Simple Post-procedure details:    Dressing:  Open (no dressing)   Procedure completion:  Tolerated well, no immediate complications   ED Course and Medical Decision Making  Initial Impression and Ddx Fall with possible loss of consciousness.  Evidence of head trauma.  Awake and alert.  Having some neck pain.  Has been having frequent syncope recently, has been having frequent heartburn and chest pain recently.  Past medical/surgical history that increases complexity of ED encounter: Frequent syncope  Interpretation of Diagnostics I personally reviewed the Chest Xray and my interpretation is as follows: No pneumothorax  Labs reveal elevated alcohol level, elevated lactic acid, CT imaging revealing bilateral subdural hematoma left greater than right, small amount of shift.  Also some subarachnoid blood.  Possible C4 transverse process fracture, also significant esophagitis and an abnormal small fluid collection in the mediastinum of unclear significance.  Patient Reassessment and Ultimate Disposition/Management       I discussed the case with Dr. Lyndel of trauma surgery, given the frequent syncope patient probably best served on the medicine service.  Can consider Gastrografin study with regard to the CT chest findings.  I discussed the case with Dr. Colon of neurosurgery, plan is for admission at [facility], repeat CT head in 12 hours, neurosurgery will follow in consultation.  2 AM update: Patient will not get a bed at [facility] until the afternoon, which would be an appropriate timing.  Transferring patient to the [facility] emergency department so that he can be evaluated by trauma and/or neurosurgery.  Accepting ED provider is Dr. Trine.  Patient management required discussion with the following services or consulting groups:  Hospitalist Service, General/Trauma Surgery, and Neurosurgery  Complexity of Problems Addressed Acute illness or injury that poses threat of life of  bodily function  Additional Data  Reviewed and Analyzed Further history obtained from: Further history from spouse/family member  Additional Factors Impacting ED Encounter Risk Use of parenteral controlled substances and Consideration of hospitalization  Itzae Miralles. Theadore, MD [facility] Emergency Medicine [facility] [email or web address]  Final Clinical Impressions(s) / ED Diagnoses     ICD-10-CM   1. Subdural hematoma (HCC)  S06.5XAA       ED Discharge Orders     None        Discharge Instructions Discussed with and Provided to Patient:   Discharge Instructions   None      Theadore Ozell HERO, MD 12/02/23 0157    Theadore Ozell HERO, MD 12/03/23 0040

## 2023-12-02 NOTE — Consult Note (Signed)
 Patient ID: [name] Sep 18, 1965  Admit date: 12/01/2023 Admitting Diagnoses: Fall  Tertiary Survey: Trauma scans reviewed: Yes Additional Imaging recommendations: Yes: XR L shoulder, L knee  Labs reviewed: Yes Additional lab work recommendations: No  Does the patient have any new complaints? No Cervical Collar Cleared: Yes DVT ppx ordered? No, defer to recs from NSGY Note: dosing for DVT prophylaxis in the setting of trauma and normal renal function is 30mg  BID or 40mg  BID if >100kg or BMI>35 Is patient age > 22 (58 y.o.) : No  Exam: Gen: comfortable, no distress Neuro: non-focal exam HEENT: PERRL Neck: supple CV: RRR Pulm: unlabored breathing Abd: soft, NT GU: clear yellow urine Extr: wwp, no edema, L shoulder and L knee pain  Multifocal ICH - NSGY c/s, Dr. Colon Recurrent falls - wife reports o/p workup suggestive of POTS, coupled with poor insight from patient regarding activities/assistance L shoulder pain - XR negative L knee pain - ?FB present, patient reports knee injury pre-existing this fall, will reassess to see if there is anything visible external, if not would recommend supportive care FEN - CLD recommend advancing to regular diet DVT - SCDs, recommend LMWH 30mg  BID when cleared by NSGY Dispo - pain regimen adjusted, PT/OT/SLP, trauma to sign off  Incidental Findings:  none  Wound Care: keep all wounds clean and dry  Follow-up Appointments: Trauma clinic prn  Frailty Score    Daniel [name], MD General and Trauma Surgery [facility] Surgery

## 2023-12-02 NOTE — Progress Notes (Signed)
 Patient ID: [name], male   DOB: [birth year], 58 y.o.   MRN: [id number] Patient's follow-up CT scan shows that there is slight decrease in size of the acute subdural hematoma.  Neurologically the patient is doing quite well and he is much more lucid and clear this afternoon.  I will sign off at this time he can have a follow-up appointment with me in about 2 weeks time with a follow-up CT then.

## 2023-12-03 DIAGNOSIS — S065XAA Traumatic subdural hemorrhage with loss of consciousness status unknown, initial encounter: Secondary | ICD-10-CM | POA: Diagnosis not present

## 2023-12-03 LAB — CBC WITH DIFFERENTIAL/PLATELET
Abs Immature Granulocytes: 0.07 K/uL (ref 0.00–0.07)
Basophils Absolute: 0.1 K/uL (ref 0.0–0.1)
Basophils Relative: 1 %
Eosinophils Absolute: 0.3 K/uL (ref 0.0–0.5)
Eosinophils Relative: 2 %
HCT: 40.4 % (ref 39.0–52.0)
Hemoglobin: 13.8 g/dL (ref 13.0–17.0)
Immature Granulocytes: 1 %
Lymphocytes Relative: 21 %
Lymphs Abs: 3 K/uL (ref 0.7–4.0)
MCH: 34.7 pg — ABNORMAL HIGH (ref 26.0–34.0)
MCHC: 34.2 g/dL (ref 30.0–36.0)
MCV: 101.5 fL — ABNORMAL HIGH (ref 80.0–100.0)
Monocytes Absolute: 1.9 K/uL — ABNORMAL HIGH (ref 0.1–1.0)
Monocytes Relative: 13 %
Neutro Abs: 9.1 K/uL — ABNORMAL HIGH (ref 1.7–7.7)
Neutrophils Relative %: 62 %
Platelets: 249 K/uL (ref 150–400)
RBC: 3.98 MIL/uL — ABNORMAL LOW (ref 4.22–5.81)
RDW: 12.2 % (ref 11.5–15.5)
WBC: 14.5 K/uL — ABNORMAL HIGH (ref 4.0–10.5)
nRBC: 0 % (ref 0.0–0.2)

## 2023-12-03 LAB — BASIC METABOLIC PANEL WITH GFR
Anion gap: 9 (ref 5–15)
BUN: 7 mg/dL (ref 6–20)
CO2: 25 mmol/L (ref 22–32)
Calcium: 8.2 mg/dL — ABNORMAL LOW (ref 8.9–10.3)
Chloride: 101 mmol/L (ref 98–111)
Creatinine, Ser: 1.08 mg/dL (ref 0.61–1.24)
GFR, Estimated: >60 mL/min (ref >60)
Glucose, Bld: 116 mg/dL — ABNORMAL HIGH (ref 70–99)
Potassium: 4.1 mmol/L (ref 3.5–5.1)
Sodium: 135 mmol/L (ref 135–145)

## 2023-12-03 LAB — LACTIC ACID, PLASMA: Lactic Acid, Venous: 0.8 mmol/L (ref 0.5–1.9)

## 2023-12-03 MED ORDER — BUSPIRONE HCL 10 MG PO TABS
10.0000 mg | ORAL_TABLET | Freq: Every morning | ORAL | Status: DC
  Administered 2023-12-04 – 2023-12-12 (×9): 10 mg via ORAL
  Filled 2023-12-03 (×9): qty 1

## 2023-12-03 MED ORDER — FLUOXETINE HCL 20 MG PO CAPS
60.0000 mg | ORAL_CAPSULE | Freq: Every day | ORAL | Status: DC
  Administered 2023-12-04 – 2023-12-07 (×4): 60 mg via ORAL
  Filled 2023-12-03 (×4): qty 3

## 2023-12-03 MED ORDER — BUSPIRONE HCL 10 MG PO TABS
15.0000 mg | ORAL_TABLET | Freq: Every evening | ORAL | Status: DC
  Administered 2023-12-04 – 2023-12-12 (×9): 15 mg via ORAL
  Filled 2023-12-03 (×9): qty 2

## 2023-12-03 MED ORDER — PANTOPRAZOLE SODIUM 40 MG PO TBEC
40.0000 mg | DELAYED_RELEASE_TABLET | Freq: Every day | ORAL | Status: DC
  Administered 2023-12-03 – 2023-12-12 (×10): 40 mg via ORAL
  Filled 2023-12-03 (×10): qty 1

## 2023-12-03 MED ORDER — PROCHLORPERAZINE EDISYLATE 10 MG/2ML IJ SOLN
10.0000 mg | Freq: Four times a day (QID) | INTRAMUSCULAR | Status: DC | PRN
  Administered 2023-12-03 (×2): 10 mg via INTRAVENOUS
  Filled 2023-12-03 (×2): qty 2

## 2023-12-03 MED ORDER — SENNOSIDES-DOCUSATE SODIUM 8.6-50 MG PO TABS
1.0000 | ORAL_TABLET | Freq: Every day | ORAL | Status: DC
  Administered 2023-12-03 – 2023-12-10 (×6): 1 via ORAL
  Filled 2023-12-03 (×8): qty 1

## 2023-12-03 MED ORDER — POLYETHYLENE GLYCOL 3350 17 G PO PACK
17.0000 g | PACK | Freq: Every day | ORAL | Status: DC | PRN
Start: 2023-12-03 — End: 2023-12-12
  Administered 2023-12-06: 17 g via ORAL
  Filled 2023-12-03: qty 1

## 2023-12-03 MED ORDER — ACETAMINOPHEN 500 MG PO TABS
1000.0000 mg | ORAL_TABLET | Freq: Three times a day (TID) | ORAL | Status: DC
  Administered 2023-12-03 – 2023-12-12 (×24): 1000 mg via ORAL
  Filled 2023-12-03 (×26): qty 2

## 2023-12-03 MED ORDER — QUETIAPINE FUMARATE 50 MG PO TABS
50.0000 mg | ORAL_TABLET | Freq: Every day | ORAL | Status: DC
  Administered 2023-12-03 – 2023-12-11 (×9): 50 mg via ORAL
  Filled 2023-12-03 (×9): qty 1

## 2023-12-03 MED ORDER — BUSPIRONE HCL 10 MG PO TABS: 10.0000 mg | ORAL_TABLET | ORAL | Status: DC

## 2023-12-03 MED ORDER — ALPRAZOLAM 0.25 MG PO TABS
1.0000 mg | ORAL_TABLET | Freq: Every day | ORAL | Status: DC | PRN
Start: 2023-12-03 — End: 2023-12-12
  Administered 2023-12-03 – 2023-12-11 (×7): 1 mg via ORAL
  Filled 2023-12-03 (×7): qty 4

## 2023-12-03 NOTE — Progress Notes (Signed)
 PROGRESS NOTE  [name]  DOB: [birth year]  PCP: Maree Isles, MD FMW:[id number]  DOA: 12/01/2023  LOS: 1 day  Hospital Day: 3  Subjective: Patient was seen and examined this morning. Propped up in bed.  Wife at bedside. Says he could not get good sleep last night because of frequent disturbances.  Reportedly has O2 sat down in 80s last night and hence placed on low-flow oxygen.  Complains of intermittent headache.  Received Zofran  and Compazine this morning for nausea and was drowsy at the time of my evaluation.  Afebrile, hemodynamically stable Labs this morning with WC count 14.5, lactic acid normal, renal function normal  Brief narrative: [name] is a 58 y.o. male with PMH significant for HTN, chronic back pain, GERD, anxiety, h/o left nephrectomy, recurrent syncope, falls secondary to dysautonomia and orthostatic hypotension. 11/4, patient was brought to the ED at [facility] by EMS after an unwitnessed fall.  Per report, patient had 1 drink at his neighbors house, walked back to his garage and fell.  He was later found by his wife on the floor covered in blood, called EMS. EMS noted right forehead scalp laceration, hematoma. EMS put him on a c-collar and brought to ED.  On arrival, patient was hypothermic at 94 degrees, warming intervention implemented Other vital signs were normal. On initial Triage, he was frequently repeating himself.  Noted to have approximately 5 to 6 mm laceration to left forehead that required sutures. Initial labs with WC count 16.6, hemoglobin 16.6, sodium 132, renal function normal, lactic acid 4.5, CK troponin normal, blood alcohol level elevated to 85 UDS positive for benzodiazepine Urinalysis normal Chest x-ray, x-ray pelvis were normal CT head showed 1. Acute bilateral convexity subdural hematomas, L>R, with associated interhemispheric/ para falcine hemorrhage and possible trace 1-2 mm rightward shift. 2. Small subarachnoid  hemorrhage at the right frontal vertex. 3. Globular extra-axial collections at the right vertex, possibly focal subdural or subarachnoid blood, measuring up to 8 mm. 4. Moderate right forehead scalp laceration and hematoma.  CT cervical spine showed - Questionable nondisplaced C4 spinous process fracture - Trace anterolisthesis at C4 on C5 and C5 and C6 - Moderate degenerative changes at C5-C6 and C6-C7  CT scan maxillofacial showed - acute mildly depressed nasal bone fracture.   - Moderate right forehead laceration and hematoma with moderate-to-marked right periorbital hematoma.   CT chest, abdomen and pelvis did not show any traumatic injury but showed diffuse esophageal wall thickening with mild surrounding inflammation descending small paraesophageal lymph nodes suggestive of esophagitis  Patient was given IV pain control, IV hydration Trauma surgeon on-call Dr. Lyndel was contacted who recommended admitting patient to TRH service at [facility] per   Neurosurgery (Dr. Colon) was consulted and recommended admitting patient to [facility] and to repeat CTA head in 12 hours.  Because of inpatient bed at [facility] not being available, patient was transferred from [facility] ED to [facility] ED Admitted to [facility] 11/5, repeat CT head showed slight interval decrease in size of right SDH.  Stable appearance of left SDH and SAH  Assessment and plan: Traumatic head, neck, facial injury -Bilateral subdural hematoma (L > R ) -Subarachnoid hemorrhage -Nondisplaced C4 spinous process fracture -Right forehead scalp laceration and hematoma Secondary to fall at home Has h/o recurrent syncope and fall secondary to dysautonomia and orthostatic hypotension.  Was also under the influence of alcohol at the time.  UDS positive for benzodiazepine.  Patient uses Xanax as needed  at home Trauma surgery and neurosurgery were consulted. CT head was stable SDH and SAH.  No surgical intervention per  neurosurgery. Regarding cervical spine findings, per Dr. Colon, in absence of cervical tenderness he would not suggest surgical intervention or even c-collar.  Continue PT eval Current pain regimen --- Scheduled: Tylenol  1 g 3 times daily, Robaxin oral 1000 mg every 8 hours --- PRN: IV Dilaudid  0.5 mg every 4 hours, oral oxycodone  5 to 10 mg every 4 hours   H/o recurrent syncope, fall Dysautonomia/orthostatic hypotension Has been struggling with this for several years.  Because of episodic rise in blood pressure, he has been taking amlodipine and lisinopril as well but at times his blood pressure dips down significantly and he passes out.  He follows with PCP and cardiologist.   Currently being monitored in telemetry. 11/5, orthostatic vital signs taken, did not show drop (see below) Echo 10/5 with EF 65 to 70%, no WMA, G1 DD, normal size and function, RV PTA meds- amlodipine 5 mg nightly, lisinopril 20 mg daily Currently both on hold.  Heart rate and blood pressure have remained in normal range. Home med list also shows sildenafil as needed for ED. I would suggest to not use it given recurrent hypotension and syncope Orthostatic VS for the past 24 hrs (Last 3 readings):  BP- Lying Pulse- Lying BP- Sitting Pulse- Sitting BP- Standing at 0 minutes Pulse- Standing at 0 minutes BP- Standing at 3 minutes Pulse- Standing at 3 minutes  12/02/23 1649 (!) 139/99 109 (!) 151/91 107 (!) 141/95 87 (!) 159/94 96   Leukocytosis  lactic acidosis Leukocytosis likely due to acute distress.  Lactic acidosis likely due to alcohol use. No evidence of infection. Labs this morning showed improvement in WBC and resolution of lactic acidosis Recent Labs  Lab 12/01/23 2231 12/01/23 2304 12/02/23 0427 12/02/23 0525 12/02/23 0818 12/03/23 0231  WBC 16.6*  --  17.5*  --   --  14.5*  LATICACIDVEN  --  4.5*  --  2.3* 2.0* 0.8   Possible OSA Nocturnal hypoxia Patient is pending outpatient sleep study. Last  night, his oxygen level was down to 80s multiple occasions and she started on low-flow oxygen.  I have ordered for nocturnal pulse oximetry today to see if qualifies for supplemental oxygen at discharge  Esophagitis CT chest, abdomen and pelvis did not show any traumatic injury but showed diffuse esophageal wall thickening with mild surrounding inflammation descending small paraesophageal lymph nodes suggestive of esophagitis. Patient endorses longstanding history of reflux and takes Prilosec intermittently. Reports colonoscopy in the past with GI attending but no endoscopy.  Would benefit from GI evaluation as an outpatient  H/o diverticulosis S/p colectomy, colostomy reversal Continue bowel regimen  Alcohol intoxication Alcohol level was 285.  Patient and wife state that he very occasionally drinks.  On the day of presentation, he gave company to his neighbor and just took 1 drink just prior to fall.  No sign of any withdrawal symptoms at this time   Hyponatremia Mild.  Improved Recent Labs  Lab 12/01/23 2231 12/02/23 0427 12/03/23 0231  NA 132* 136 135   Chronic back pain  anxiety/depression PTA meds- BuSpar 10 mg mg/15 mg mg -am/pm, fluoxetine 60 mg daily, Seroquel 50 mg nightly, Xanax 1 mg daily as needed, Neurontin nightly as needed Cardiology were on hold while somnolent  COPD Bronchodilators.  Left knee pain 11/5, left knee x-ray showed possible foreign body adjacent to the lateral patella. On my exam  this morning, patient has a healing scab on the left, but no presence of foreign body  h/o left nephrectomy After trauma at the age of 61   Mobility: PT eval  PT Orders: Active   PT Follow up Rec: Outpatient Pt (Vestibular/Neurologic Rehab)12/03/2023 1326   Goals of care   Code Status: Full Code     DVT prophylaxis:  SCDs Start: 12/02/23 0410   Antimicrobials: None Fluid: No longer on fluid Consultants: Trauma surgery, neurosurgery Family Communication:  Wife at bedside  Status: Inpatient Level of care:  Progressive   Patient is from: Home Needs to continue in-hospital care: Continue monitor for mental status change.  If stable, can probably discharge home tomorrow   Diet:  Diet Order             Diet regular Room service appropriate? Yes; Fluid consistency: Thin  Diet effective now                   Scheduled Meds:  acetaminophen   1,000 mg Oral Q8H   methocarbamol  1,000 mg Oral Q8H   pantoprazole  40 mg Oral Daily   pneumococcal 20-valent conjugate vaccine  0.5 mL Intramuscular Tomorrow-1000   senna-docusate  1 tablet Oral QHS   Tdap  0.5 mL Intramuscular Once    PRN meds: HYDROmorphone  (DILAUDID ) injection, ondansetron  **OR** ondansetron  (ZOFRAN ) IV, oxyCODONE , polyethylene glycol, prochlorperazine   Infusions:     Antimicrobials: Anti-infectives (From admission, onward)    None       Objective: Vitals:   12/03/23 0830 12/03/23 1128  BP: 137/84 126/77  Pulse: 82 (!) 57  Resp: 16 17  Temp: 97.8 F (36.6 C) 97.7 F (36.5 C)  SpO2: 90% 94%    Intake/Output Summary (Last 24 hours) at 12/03/2023 1348 Last data filed at 12/03/2023 0800 Gross per 24 hour  Intake 1255.4 ml  Output 1500 ml  Net -244.6 ml   Filed Weights   12/01/23 2231  Weight: 107.5 kg   Weight change:  Body mass index is 30.43 kg/m.   Physical Exam: General exam: Pleasant, middle-aged Caucasian male.  Not in pain.   Skin: No rashes, lesions or ulcers. HEENT: Sutured wound on the right anterior forehead.  Dried blood on the face Lungs: Clear to auscultation bilaterally,  CVS: S1, S2, no murmur,   GI/Abd: Soft, nontender, nondistended, bowel sound present, old scar from previous surgery CNS: Somnolent. Drowsy from the influence of Zofran  and Compazine Psychiatry: Mood appropriate Extremities: No pedal edema, no calf tenderness  Data Review: I have personally reviewed the laboratory data and studies available.  F/u labs  ordered Unresulted Labs (From admission, onward)    None       Signed, Chapman Rota, MD [facility] 12/03/2023

## 2023-12-03 NOTE — Evaluation (Addendum)
 Occupational Therapy Evaluation Patient Details Name: [name] MRN: [id number] DOB: September 05, 1965 Today's Date: 12/03/2023   History of Present Illness   Patient is a 58 y/o male admitted 12/01/23 found down on driveway by wife.  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  CT c-spine positive for nondisplaced C4 spinous process fx.  PMH HTN, chronic back pain, GERD, h/o L nephrectomy and recent work up for dysautonomia and orthostatic hypotension.     Clinical Impressions Pt received in supine, agreeable for OT visit. Supportive wife at bedside. PTA, pt was fully independent and working, driving. Has good support from his wife upon d/c. Today, he presented rather drowsy (wakefulness somewhat improving with EOB and mobility). Intact strength in BUE, + orthostatic vitals despite pt asymptomatic (BP recovers with time - see below). He demonstrates cognitive deficits related to attention, problem solving, STM recall, and safety insight. Functionally, he was no more than CGA for functional transfers, ambulation, and UB/LB ADLs in sitting & standing. Discussed with pt & wife home modifications, postural awareness during ADLs, and general home safety/falls prevention. Session limited by drowsiness.  Pt is currently functioning below baseline and would benefit from ongoing acute OT services to progress towards safe discharge and to facilitate return to prior level of function. Current recommendation is home with assistance as detailed below.  Orthostatic Vitals:  Supine BP: 141/87 (103) Seated EOB BP: 130/87 (101) Standing x1' BP: 109/85 (94) Standing x3' BP: 121/84 (95) Supine (recovery) BP: 133/94 (107) HR fluctuating between 60-90s throughout     If plan is discharge home, recommend the following:   A little help with  bathing/dressing/bathroom;Assistance with cooking/housework;Direct supervision/assist for medications management;Direct supervision/assist for financial management;Assist for transportation;Supervision due to cognitive status     Functional Status Assessment   Patient has had a recent decline in their functional status and demonstrates the ability to make significant improvements in function in a reasonable and predictable amount of time.     Equipment Recommendations   None recommended by OT (pt has adequate DME)     Recommendations for Other Services         Precautions/Restrictions   Precautions Precautions: Fall Precaution/Restrictions Comments: monitor BP, orthostatic Required Braces or Orthoses:  (no brace needed for C4 SP fx  per Dr. Grant on 12/03/23) Restrictions Weight Bearing Restrictions Per Provider Order: No     Mobility Bed Mobility Overal bed mobility: Needs Assistance Bed Mobility: Supine to Sit, Sit to Supine     Supine to sit: HOB elevated, Used rails, Supervision Sit to supine: Supervision, Used rails   General bed mobility comments: Cued through modified technique to exit bed to protect c-spine, exited to the R side    Transfers Overall transfer level: Needs assistance Equipment used: None Transfers: Sit to/from Stand Sit to Stand: Contact guard assist           General transfer comment: Stood with cues for safety and stability upon standing      Balance Overall balance assessment: Needs assistance Sitting-balance support: No upper extremity supported, Feet supported Sitting balance-Leahy Scale: Good Sitting balance - Comments: seated EOB, no overt LOB noted   Standing balance support: No upper extremity supported, During functional activity Standing balance-Leahy Scale: Fair Standing balance comment: intermittent anterior/posterior truncal sway in stance requiring PRN CGA for safety  ADL  either performed or assessed with clinical judgement   ADL Overall ADL's : Needs assistance/impaired Eating/Feeding: Set up;Bed level   Grooming: Contact guard assist;Standing;Wash/dry hands;Wash/dry face           Upper Body Dressing : Contact guard assist;Sitting   Lower Body Dressing: Contact guard assist;Sitting/lateral leans   Toilet Transfer: Economist Details (indicate cue type and reason): cues for safe approach, + use of grab bars to assist Toileting- Clothing Manipulation and Hygiene: Contact guard assist;Sitting/lateral lean       Functional mobility during ADLs: Contact guard assist       Vision Baseline Vision/History: 0 No visual deficits Ability to See in Adequate Light: 0 Adequate Patient Visual Report: Blurring of vision Additional Comments: pt with R periorbital edema, impeding OD vision, able to compensate with postural changes and gazing with OS     Perception         Praxis         Pertinent Vitals/Pain Pain Assessment Pain Assessment: 0-10 Pain Score: 7  Pain Location: HA Pain Descriptors / Indicators: Discomfort, Grimacing, Pressure, Tender Pain Intervention(s): Limited activity within patient's tolerance, Monitored during session, Repositioned     Extremity/Trunk Assessment Upper Extremity Assessment Upper Extremity Assessment: Overall WFL for tasks assessed   Lower Extremity Assessment Lower Extremity Assessment: Defer to PT evaluation   Cervical / Trunk Assessment Cervical / Trunk Assessment: Other exceptions Cervical / Trunk Exceptions: C4 SP fx   Communication Communication Communication: No apparent difficulties   Cognition Arousal: Lethargic (drowsy) Behavior During Therapy: WFL for tasks assessed/performed Cognition: Cognition impaired   Orientation impairments:  (AXO4) Awareness: Intellectual awareness impaired Memory impairment (select all impairments): Short-term  memory, Working memory Attention impairment (select first level of impairment): Sustained attention Executive functioning impairment (select all impairments): Reasoning, Problem solving, Organization, Sequencing OT - Cognition Comments: pleasant & participatory, drowsy, often falling asleep mid conversation, wakefulness somewhat improved with EOB mobility; distracted and with poor attention, forgetful with STM recall                 Following commands: Intact       Cueing  General Comments   Cueing Techniques: Verbal cues;Visual cues  R periorbital edema and head lac to upper R forehead noted; SpO2 desat to ~87% on RA, re-applied 2L [place] with improved O2 sats to ~92%, HR 70-90s throughout   Exercises     Shoulder Instructions      Home Living Family/patient expects to be discharged to:: Private residence Living Arrangements: Spouse/significant other Available Help at Discharge: Family Type of Home: House Home Access: Stairs to enter Secretary/administrator of Steps: 2 Entrance Stairs-Rails: Right Home Layout: One level     Bathroom Shower/Tub: Producer, Television/film/video: Handicapped height (one of each)     Home Equipment: Shower seat      Lives With: Spouse    Prior Functioning/Environment Prior Level of Function : Independent/Modified Independent             Mobility Comments: working driving delivery truck      OT Problem List: Decreased activity tolerance;Impaired balance (sitting and/or standing);Impaired vision/perception;Decreased cognition;Decreased safety awareness;Pain   OT Treatment/Interventions: Self-care/ADL training;Therapeutic exercise;Therapeutic activities;Cognitive remediation/compensation;Balance training;Patient/family education      OT Goals(Current goals can be found in the care plan section)   Acute Rehab OT Goals Patient Stated Goal: go home OT Goal Formulation: With patient/family Time For Goal Achievement:  12/17/23 Potential to Achieve  Goals: Good   OT Frequency:  Min 2X/week    Co-evaluation              AM-PAC OT 6 Clicks Daily Activity     Outcome Measure Help from another person eating meals?: None Help from another person taking care of personal grooming?: A Little Help from another person toileting, which includes using toliet, bedpan, or urinal?: A Little Help from another person bathing (including washing, rinsing, drying)?: A Little Help from another person to put on and taking off regular upper body clothing?: A Little Help from another person to put on and taking off regular lower body clothing?: A Little 6 Click Score: 19   End of Session Equipment Utilized During Treatment: Gait belt Nurse Communication: Mobility status  Activity Tolerance: Patient limited by lethargy (drowsiness) Patient left: in bed;with call bell/phone within reach;with bed alarm set;with family/visitor present  OT Visit Diagnosis: Unsteadiness on feet (R26.81);Repeated falls (R29.6);History of falling (Z91.81);Cognitive communication deficit (R41.841);Dizziness and giddiness (R42);Pain Symptoms and signs involving cognitive functions: Nontraumatic SAH (SDH) Pain - part of body:  (head)                Time: 9093-9061 OT Time Calculation (min): 32 min Charges:  OT General Charges $OT Visit: 1 Visit OT Evaluation $OT Eval Moderate Complexity: 1 Mod OT Treatments $Self Care/Home Management : 8-22 mins  [name] D., MSOT, OTR/L Acute Rehabilitation Services [phone] Secure Chat Preferred   [name] 12/03/2023, 12:32 PM

## 2023-12-03 NOTE — Evaluation (Addendum)
 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   Lymphocytosis 08/26/2022   Adjustment disorder with mixed anxiety and depressed mood 12/03/2021   Past Medical History:  Past Medical History:  Diagnosis Date   Anxiety    Diverticulitis    H/O left nephrectomy    Hypertension    Past Surgical History:  Past Surgical History:  Procedure Laterality Date   APPENDECTOMY     COLONOSCOPY  2019   COLONOSCOPY WITH PROPOFOL  N/A 09/26/2022   Procedure: COLONOSCOPY WITH PROPOFOL ;  Surgeon: Cinderella Deatrice FALCON, MD;  [facility]: [facility];  Service: Endoscopy;  Laterality: N/A;  9:15AM;ASA 2   POLYPECTOMY  09/26/2022   Procedure: POLYPECTOMY;  Surgeon: Cinderella Deatrice FALCON, MD;  Location: [facility];  Service: Endoscopy;;   sigmoid removal     HPI:  [name] is a 58 y.o. male with medical history significant of hypertension, chronic back pain, GERD who presents to the emergency department via EMS after an unwitnessed fall on the driveway where he sustained a right forehead scalp laceration and hematoma; MRI revealed mutiple SDH (frontal/frontoparietal regions); baseline memory recall/inattention per wife's/pt reports d/t recent falls.  ST consulted for speech language/cognitive assessment.  Assessment / Plan / Recommendation Clinical Impression  Pt presents with cognitive/linguistic deficits c/b decreased safety awareness (execution), memory recall, decreased recall of new information and decreased STM.  Multi-factorial d/t prior anxiety, potential apneic episodes (per wife's report) and new fall resulting in multiple SDH in the frontal/frontoparietal  regions impacting performance. ST will f/u for cognitive/linguistic deficits in acute setting.  F/u recommended at next venue.  Pt administered portions of the [employer] Mental Status Examination (SLUMS) with a total score not obtained, but adequate performance noted within the areas of simple calculation, orientation (Ox4), and sustained attention (required min cues to maintain alertness level).  Deficits noted with decreased memory recall for new information/STM tasks with category cues required for delayed recall of objects and 75% accuracy achieved with immediate recall of a paragraph.  Other areas of concern include complex problem solving and safety awareness execution.  Pt could identify simple problem solving solutions, but does not always execute these (likely d/t memory deficits) per wife's report.  Fluctuating mentation impacting performance d/t multiple reasons as stated above. Pt required min-mod verbal cueing to maintain alertness for assessment.      SLP Assessment  SLP Recommendation/Assessment: Patient needs continued Speech Language Pathology Services SLP Visit Diagnosis: Cognitive communication deficit (R41.841)     Assistance Recommended at Discharge  Intermittent Supervision/Assistance  Functional Status Assessment Patient has had a recent decline in their functional status and demonstrates the ability to make significant improvements in function in a reasonable and predictable amount of time.  Frequency and Duration min 2x/week  1 week      SLP Evaluation Cognition  Overall Cognitive Status: Impaired/Different from baseline Arousal/Alertness: Suspect due to medications Orientation Level: Oriented X4 Attention: Sustained Sustained Attention: Appears intact Memory: Impaired Memory Impairment: Retrieval deficit;Decreased recall of new information;Decreased short term memory Decreased Short Term Memory: Verbal basic;Functional basic Awareness:  Impaired Awareness Impairment: Anticipatory impairment Problem Solving: Impaired Problem Solving Impairment: Verbal complex;Functional complex Executive  Function: Reasoning Reasoning: Impaired Reasoning Impairment: Verbal basic;Functional basic Behaviors: Other (comment) (lethargic; needs reinforcement/cueing to maintain alertness) Safety/Judgment: Impaired Comments: States precautions, but memory impacts execution       Comprehension  Auditory Comprehension Overall Auditory Comprehension: Appears within functional limits for tasks assessed Conversation: Simple Interfering Components: Anxiety;Visual impairments;Working Radio Broadcast Assistant: Dietitian: Within [employer] Reading Comprehension Reading Status: Within funtional limits    Expression Expression Primary Mode of Expression: Verbal Verbal Expression Overall Verbal Expression: Appears within functional limits for tasks assessed Level of Generative/Spontaneous Verbalization: Conversation Naming: No impairment Pragmatics: Unable to assess Interfering Components: Other (comment) (lethargy/suspect d/t medications) Non-Verbal Means of Communication: Not applicable Written Expression Dominant Hand: Right Written Expression: Within Functional Limits   Oral / Motor  Oral Motor/Sensory Function Overall Oral Motor/Sensory Function: Within functional limits Motor Speech Overall Motor Speech: Appears within functional limits for tasks assessed Respiration: Within functional limits Phonation: Normal Resonance: Within functional limits Articulation: Within functional limitis Intelligibility: Intelligible Motor Planning: Within functional limits Motor Speech Errors: Not applicable            Pat Akshay Spang,M.S.,CCC-SLP 12/03/2023, 11:58 AM

## 2023-12-03 NOTE — TOC CAGE-AID Note (Signed)
 Transition of Care [facility]) - CAGE-AID Screening   Patient Details  Name: [name] MRN: [id number] Date of Birth: March 18, 1965  [facility]) CM/SW Contact:    Klover Priestly E Maudine Kluesner, LCSW Phone Number: 12/03/2023, 9:37 AM   Clinical Narrative: Patient states he does not drink alcohol anymore, denies other substance use. Denies resource needs.    CAGE-AID Screening:    Have You Ever Felt You Ought to Cut Down on Your Drinking or Drug Use?: No Have People Annoyed You By Critizing Your Drinking Or Drug Use?: No Have You Felt Bad Or Guilty About Your Drinking Or Drug Use?: No Have You Ever Had a Drink or Used Drugs First Thing In The Morning to Steady Your Nerves or to Get Rid of a Hangover?: No CAGE-AID Score: 0  Substance Abuse Education Offered: Yes

## 2023-12-03 NOTE — Progress Notes (Addendum)
 Physical Therapy Treatment Patient Details Name: [name] MRN: [id number] DOB: 1966/01/16 Today's Date: 12/03/2023   History of Present Illness Patient is a 58 y/o male admitted 12/01/23 found down on driveway by wife.  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  CT c-spine positive for nondisplaced C4 spinous process fx.  PMH HTN, chronic back pain, GERD, h/o L nephrectomy and recent work up for dysautonomia and orthostatic hypotension.    PT Comments  Patient initially screened for followup on Left horizontal canal BPPV with supine head roll negative to each side. Progressed activity slowly with orthostatic BPs measured (see below) and repeated education on if he has symptoms he needs to sit down and put head between his knees (even if that means sitting on the floor because there is not a chair close by). Wife requested pt use RW and provided education on safe use. Pt did not have lateral drift with RW that was documented during gait 11/5.   Orthostatic BPs  Supine 121/87 HR 58  Sitting 117/83  60  Standing 102/79  83  Standing after 3 min 109/55  84    Standing after walking            123/82  82   If plan is discharge home, recommend the following: Help with stairs or ramp for entrance;Assist for transportation;Assistance with cooking/housework   Can travel by private vehicle        Equipment Recommendations  Rolling walker (2 wheels)    Recommendations for Other Services       Precautions / Restrictions Precautions Precautions: Fall Recall of Precautions/Restrictions: Intact Precaution/Restrictions Comments: monitor BP, orthostatic Required Braces or Orthoses:  (no brace needed for C4 SP fx  per Dr. Grant on 12/03/23) Restrictions Weight Bearing Restrictions Per Provider Order: No     Mobility  Bed  Mobility Overal bed mobility: Needs Assistance Bed Mobility: Supine to Sit, Sit to Supine     Supine to sit: HOB elevated, Supervision Sit to supine: Supervision   General bed mobility comments: recalled modified technique to exit bed to protect c-spine, exited to the R side    Transfers Overall transfer level: Needs assistance Equipment used: Rolling walker (2 wheels) Transfers: Sit to/from Stand Sit to Stand: Contact guard assist           General transfer comment: Stood with cues for safety and stability due to h/o orthostasis with mild drop in BP    Ambulation/Gait Ambulation/Gait assistance: Contact guard assist Gait Distance (Feet): 200 Feet Assistive device: Rolling walker (2 wheels) Gait Pattern/deviations: Wide base of support, Step-through pattern, Decreased stride length       General Gait Details: wife requested pt try RW and he agreed; educated that RW use is no guarantee that he will not fall   Stairs             Wheelchair Mobility     Tilt Bed    Modified Rankin (Stroke Patients Only)       Balance Overall balance assessment: Needs assistance Sitting-balance support: No upper extremity supported, Feet supported Sitting balance-Leahy Scale: Good Sitting balance - Comments: seated EOB, no overt LOB noted   Standing balance support: No upper extremity supported, During functional activity Standing balance-Leahy Scale: Fair Standing balance comment: intermittent anterior/posterior truncal sway in stance requiring PRN CGA for safety  Communication Communication Communication: No apparent difficulties  Cognition Arousal: Lethargic, Suspect due to medications (drowsy) Behavior During Therapy: WFL for tasks assessed/performed                           PT - Cognition Comments: Wife noted pt has not been recalling full history of events he has been through recently Following commands:  Intact      Cueing Cueing Techniques: Verbal cues, Visual cues  Exercises Other Exercises Other Exercises: completed supine head roll bil with no nystagmus or symptoms elicited.    General Comments General comments (skin integrity, edema, etc.): on 2L on arrival with sats 95% HR 83; on RA decr to 86% HR 97; resumed 2L and during ambulation 95% HR 85.      Pertinent Vitals/Pain Pain Assessment Pain Assessment: Faces Faces Pain Scale: Hurts little more Pain Location: HA Pain Descriptors / Indicators: Discomfort, Pressure, Tender Pain Intervention(s): Limited activity within patient's tolerance, Monitored during session    Home Living Family/patient expects to be discharged to:: Private residence Living Arrangements: Spouse/significant other Available Help at Discharge: Family Type of Home: House Home Access: Stairs to enter Entrance Stairs-Rails: Right Entrance Stairs-Number of Steps: 2   Home Layout: One level Home Equipment: Shower seat      Prior Function            PT Goals (current goals can now be found in the care plan section) Acute Rehab PT Goals Patient Stated Goal: return home PT Goal Formulation: With patient/family Time For Goal Achievement: 12/09/23 Potential to Achieve Goals: Good Progress towards PT goals: Progressing toward goals    Frequency    Min 3X/week      PT Plan      Co-evaluation              AM-PAC PT 6 Clicks Mobility   Outcome Measure  Help needed turning from your back to your side while in a flat bed without using bedrails?: A Little Help needed moving from lying on your back to sitting on the side of a flat bed without using bedrails?: A Little Help needed moving to and from a bed to a chair (including a wheelchair)?: A Little Help needed standing up from a chair using your arms (e.g., wheelchair or bedside chair)?: A Little Help needed to walk in hospital room?: A Little Help needed climbing 3-5 steps with a  railing? : A Little 6 Click Score: 18    End of Session Equipment Utilized During Treatment: Gait belt;Oxygen Activity Tolerance: Patient limited by fatigue Patient left: in bed;with call bell/phone within reach;with family/visitor present   PT Visit Diagnosis: Other abnormalities of gait and mobility (R26.89);History of falling (Z91.81);Other symptoms and signs involving the nervous system (R29.898)     Time: 8860-8782 PT Time Calculation (min) (ACUTE ONLY): 38 min  Charges:    $Gait Training: 8-22 mins $Therapeutic Activity: 8-22 mins $Self Care/Home Management: 8-22 PT General Charges $$ ACUTE PT VISIT: 1 Visit                      Macario RAMAN, PT Acute Rehabilitation Services  Office [phone]    Macario SHAUNNA Soja 12/03/2023, 1:36 PM

## 2023-12-04 ENCOUNTER — Ambulatory Visit: Admitting: Cardiology

## 2023-12-04 DIAGNOSIS — S065XAA Traumatic subdural hemorrhage with loss of consciousness status unknown, initial encounter: Secondary | ICD-10-CM | POA: Diagnosis not present

## 2023-12-04 NOTE — Progress Notes (Signed)
 Occupational Therapy Treatment Patient Details Name: [name] MRN: [id number] DOB: April 06, 1965 Today's Date: 12/04/2023   History of present illness 58 y/o male adm 12/01/23 found down on driveway by wife with bil SDH L>R, mildly depressed nasal bone fx, C4 spinous process fx.  PMHx: HTN, chronic back pain, GERD, h/o Lt nephrectomy and recent work up for dysautonomia and orthostatic hypotension.   OT comments  Pt received in supine, agreeable for OT visit. Supportive wife at bedside. Pt performed pillbox test with OT today. Pt failed pillbox test. He required 18 mins, 50 secs to complete which is outside of the allotted 5 minutes per test standards. Additionally, pt with several errors by omission and commission (28 errors in total). Pt with poor problem solving and reduced insight into his errors. OT prompting to facilitate problem solving how to rectify errors so that it was accurate. Of note, pt still drowsy, often nodding off mid-task requiring frequent cues to maintain on task at hand. Wife reports he did not sleep much again last night due to attempted sleep O2 study. Given pt's ongoing cognitive deficits related to attention, problem solving, and working memory, recommend strict assistance and supervision for all IADLs (medications, financial mgmt, driving). OT to continue to follow.      If plan is discharge home, recommend the following:  A little help with bathing/dressing/bathroom;Assistance with cooking/housework;Direct supervision/assist for medications management;Direct supervision/assist for financial management;Assist for transportation;Supervision due to cognitive status   Equipment Recommendations  None recommended by OT    Recommendations for Other Services      Precautions / Restrictions Precautions Precautions: Fall Recall of Precautions/Restrictions: Intact Precaution/Restrictions Comments: monitor BP, orthostatic Restrictions Weight Bearing Restrictions Per  Provider Order: No       Mobility Bed Mobility Overal bed mobility: Needs Assistance Bed Mobility: Supine to Sit, Sit to Supine     Supine to sit: Supervision, HOB elevated, Used rails Sit to supine: Supervision, HOB elevated, Used rails   General bed mobility comments: Exitd to the R side    Transfers                         Balance                                           ADL either performed or assessed with clinical judgement   ADL                                              Extremity/Trunk Assessment              Vision       Perception     Praxis     Communication Communication Communication: No apparent difficulties   Cognition Arousal: Lethargic, Suspect due to medications (drowsy) Behavior During Therapy: WFL for tasks assessed/performed Cognition: Cognition impaired     Awareness: Intellectual awareness impaired Memory impairment (select all impairments): Short-term memory, Working memory Attention impairment (select first level of impairment): Sustained attention Executive functioning impairment (select all impairments): Reasoning, Problem solving, Organization, Sequencing OT - Cognition Comments: drowsy, often falling asleep mid-task and mid conversation, redirectable to maintain attention on task at hand  Following commands: Intact        Cueing   Cueing Techniques: Verbal cues, Visual cues  Exercises      Shoulder Instructions       General Comments on 2L [place] throughout with O2 sats ~97-94%    Pertinent Vitals/ Pain       Pain Assessment Pain Assessment: Faces Faces Pain Scale: Hurts even more Pain Location: generalized Pain Descriptors / Indicators: Discomfort Pain Intervention(s): Monitored during session, Repositioned  Home Living                                          Prior Functioning/Environment              Frequency   Min 2X/week        Progress Toward Goals  OT Goals(current goals can now be found in the care plan section)  Progress towards OT goals: Progressing toward goals     Plan      Co-evaluation                 AM-PAC OT 6 Clicks Daily Activity     Outcome Measure   Help from another person eating meals?: None Help from another person taking care of personal grooming?: A Little Help from another person toileting, which includes using toliet, bedpan, or urinal?: A Little Help from another person bathing (including washing, rinsing, drying)?: A Little Help from another person to put on and taking off regular upper body clothing?: A Little Help from another person to put on and taking off regular lower body clothing?: A Little 6 Click Score: 19    End of Session    OT Visit Diagnosis: Unsteadiness on feet (R26.81);Repeated falls (R29.6);History of falling (Z91.81);Cognitive communication deficit (R41.841);Dizziness and giddiness (R42);Pain Symptoms and signs involving cognitive functions: Nontraumatic SAH (SDH)   Activity Tolerance Patient tolerated treatment well   Patient Left in bed;with call bell/phone within reach;with bed alarm set;with family/visitor present   Nurse Communication          Time: 8971-8892 OT Time Calculation (min): 39 min  Charges: OT General Charges $OT Visit: 1 Visit OT Treatments $Therapeutic Activity: 38-52 mins  [name] D., MSOT, OTR/L Acute Rehabilitation Services [phone] Secure Chat Preferred  [name] 12/04/2023, 3:23 PM

## 2023-12-04 NOTE — TOC Transition Note (Signed)
 Transition of Care [facility]) - Discharge Note   Patient Details  Name: [name] MRN: [id number] Date of Birth: 08/09/1965  Transition of Care [facility] CM/SW Contact:  Daniel DELENA Senters, RN Phone Number: 12/04/2023, 12:21 PM   Clinical Narrative:     Patient from home with wife, who provides support, transportation, and will manage medications. Rolling walker ordered and to be delivered to room by [employer]. Outpatient therapy for PT set up through [facility] outpatient rehab. Patient to call for appt. Info on AVS.   Nighttime O2 monitoring done 11/6 overnight. CM awaiting results to determine if patient will qualify for home O2 therapy.   CM will continue to follow.      Barriers to Discharge: Continued Medical Work up   Patient Goals and CMS Choice     Choice offered to / list presented to : Patient, Spouse      Discharge Placement                       Discharge Plan and Services Additional resources added to the After Visit Summary for     Discharge Planning Services: CM Consult            DME Arranged: Vannie rolling DME Agency: [employer] Date DME Agency Contacted: 12/04/23 Time DME Agency Contacted: 1220 Representative spoke with at DME Agency: [place]            Social Drivers of Health (SDOH) Interventions SDOH Screenings   Food Insecurity: Food Insecurity Present (12/02/2023)  Housing: High Risk (12/02/2023)  Transportation Needs: Unmet Transportation Needs (12/02/2023)  Utilities: Not At Risk (12/02/2023)  Depression (PHQ2-9): Low Risk  (09/30/2023)  Tobacco Use: High Risk (12/01/2023)     Readmission Risk Interventions     No data to display

## 2023-12-04 NOTE — Progress Notes (Addendum)
 PROGRESS NOTE  [name]  DOB: 02-01-1965  PCP: Maree Isles, MD FMW:[id number]  DOA: 12/01/2023  LOS: 2 days  Hospital Day: 4  Subjective: Patient was seen and examined this morning. He was having breakfast Wife at bedside. Reports having headaches and had just received Dilaudid  He  was looking better compared to yesterday per wife He participated with Therapy yesterday, during my encounter OT was around to see him.  Brief narrative: [name] is a 58 y.o. male with PMH significant for HTN, chronic back pain, GERD, anxiety, h/o left nephrectomy, recurrent syncope, falls secondary to dysautonomia and orthostatic hypotension. 11/4, patient was brought to the ED at [facility] by EMS after an unwitnessed fall.  Per report, patient had 1 drink at his neighbors house, walked back to his garage and fell.  He was later found by his wife on the floor covered in blood, called EMS. EMS noted right forehead scalp laceration, hematoma. EMS put him on a c-collar and brought to ED.  On arrival, patient was hypothermic at 94 degrees, warming intervention implemented Other vital signs were normal. On initial Triage, he was frequently repeating himself.  Noted to have approximately 5 to 6 mm laceration to left forehead that required sutures. Initial labs with WC count 16.6, hemoglobin 16.6, sodium 132, renal function normal, lactic acid 4.5, CK troponin normal, blood alcohol level elevated to 85 UDS positive for benzodiazepine Urinalysis normal Chest x-ray, x-ray pelvis were normal CT head showed 1. Acute bilateral convexity subdural hematomas, L>R, with associated interhemispheric/ para falcine hemorrhage and possible trace 1-2 mm rightward shift. 2. Small subarachnoid hemorrhage at the right frontal vertex. 3. Globular extra-axial collections at the right vertex, possibly focal subdural or subarachnoid blood, measuring up to 8 mm. 4. Moderate right forehead scalp laceration and  hematoma.  CT cervical spine showed - Questionable nondisplaced C4 spinous process fracture - Trace anterolisthesis at C4 on C5 and C5 and C6 - Moderate degenerative changes at C5-C6 and C6-C7  CT scan maxillofacial showed - acute mildly depressed nasal bone fracture.   - Moderate right forehead laceration and hematoma with moderate-to-marked right periorbital hematoma.   CT chest, abdomen and pelvis did not show any traumatic injury but showed diffuse esophageal wall thickening with mild surrounding inflammation descending small paraesophageal lymph nodes suggestive of esophagitis  Patient was given IV pain control, IV hydration Trauma surgeon on-call Dr. Lyndel was contacted who recommended admitting patient to TRH service at [facility] per   Neurosurgery (Dr. Colon) was consulted and recommended admitting patient to [facility] and to repeat CTA head in 12 hours.  Because of inpatient bed at [facility] not being available, patient was transferred from [facility] ED to [facility] ED Admitted to [facility] 11/5, repeat CT head showed slight interval decrease in size of right SDH.  Stable appearance of left SDH and SAH  Assessment and plan: Traumatic head, neck, facial injury -Bilateral subdural hematoma (L > R ) -Subarachnoid hemorrhage -Nondisplaced C4 spinous process fracture -Right forehead scalp laceration and hematoma Secondary to fall at home Has h/o recurrent syncope and fall secondary to dysautonomia and orthostatic hypotension.  Was also under the influence of alcohol at the time.  UDS positive for benzodiazepine.  Patient uses Xanax as needed at home Trauma surgery and neurosurgery were consulted. CT head was stable SDH and SAH.  No surgical intervention per neurosurgery. Regarding cervical spine findings, per Dr. Colon, in absence of cervical tenderness he would not suggest surgical  intervention or even c-collar.  Continue PT eval Current pain regimen --- Scheduled: Tylenol  1 g 3  times daily, Robaxin oral 1000 mg every 8 hours --- PRN: IV Dilaudid  0.5 mg every 4 hours, oral oxycodone  5 to 10 mg every 4 hours   H/o recurrent syncope, fall Dysautonomia/orthostatic hypotension Has been struggling with this for several years.  Because of episodic rise in blood pressure, he has been taking amlodipine and lisinopril as well but at times his blood pressure dips down significantly and he passes out.  He follows with PCP and cardiologist.   Currently being monitored in telemetry. 11/5, orthostatic vital signs taken, did not show drop (see below) Echo 10/5 with EF 65 to 70%, no WMA, G1 DD, normal size and function, RV PTA meds- amlodipine 5 mg nightly, lisinopril 20 mg daily Currently both on hold.  Heart rate and blood pressure have remained in normal range. Home med list also shows sildenafil as needed for ED. I would suggest to not use it given recurrent hypotension and syncope No data found.  Leukocytosis  lactic acidosis Leukocytosis likely due to acute distress.  Lactic acidosis likely due to alcohol use. No evidence of infection. Labs this morning showed improvement in WBC and resolution of lactic acidosis Recent Labs  Lab 12/01/23 2231 12/01/23 2304 12/02/23 0427 12/02/23 0525 12/02/23 0818 12/03/23 0231  WBC 16.6*  --  17.5*  --   --  14.5*  LATICACIDVEN  --  4.5*  --  2.3* 2.0* 0.8   Possible OSA Nocturnal hypoxia Patient is pending outpatient sleep study. Last night, his oxygen level was down to 80s multiple occasions and she started on low-flow oxygen.  I have ordered for nocturnal pulse oximetry today to see if qualifies for supplemental oxygen at discharge  Esophagitis CT chest, abdomen and pelvis did not show any traumatic injury but showed diffuse esophageal wall thickening with mild surrounding inflammation descending small paraesophageal lymph nodes suggestive of esophagitis. Patient endorses longstanding history of reflux and takes Prilosec  intermittently. Reports colonoscopy in the past with GI attending but no endoscopy.  Would benefit from GI evaluation as an outpatient  H/o diverticulosis S/p colectomy, colostomy reversal Continue bowel regimen  Alcohol intoxication Alcohol level was 285.  Patient and wife state that he very occasionally drinks.  On the day of presentation, he gave company to his neighbor and just took 1 drink just prior to fall.  No sign of any withdrawal symptoms at this time   Hyponatremia Mild.  Improved Recent Labs  Lab 12/01/23 2231 12/02/23 0427 12/03/23 0231  NA 132* 136 135   Chronic back pain  anxiety/depression PTA meds- BuSpar 10 mg mg/15 mg mg -am/pm, fluoxetine 60 mg daily, Seroquel 50 mg nightly, Xanax 1 mg daily as needed, Neurontin nightly as needed Cardiology were on hold while somnolent  COPD Bronchodilators.  Left knee pain 11/5, left knee x-ray showed possible foreign body adjacent to the lateral patella. On my exam this morning, patient has a healing scab on the left, but no presence of foreign body  h/o left nephrectomy After trauma at the age of 58   Mobility: PT eval  PT Orders: Active   PT Follow up Rec: Outpatient Pt (Vestibular/Neurologic Rehab)12/03/2023 1326   Goals of care   Code Status: Full Code     DVT prophylaxis:  SCDs Start: 12/02/23 0410   Antimicrobials: None Fluid: No longer on fluid Consultants: Trauma surgery, neurosurgery Family Communication: Wife at bedside  Status: Inpatient  Level of care:  Progressive   Patient is from: Home Needs to continue in-hospital care: Continue monitor for mental status change.  If stable, can probably discharge home tomorrow   Diet:  Diet Order             Diet regular Room service appropriate? Yes; Fluid consistency: Thin  Diet effective now                   Scheduled Meds:  acetaminophen   1,000 mg Oral Q8H   busPIRone  10 mg Oral q morning   And   busPIRone  15 mg Oral QPM    FLUoxetine  60 mg Oral Daily   methocarbamol  1,000 mg Oral Q8H   pantoprazole  40 mg Oral Daily   pneumococcal 20-valent conjugate vaccine  0.5 mL Intramuscular Tomorrow-1000   QUEtiapine  50 mg Oral QHS   senna-docusate  1 tablet Oral QHS   Tdap  0.5 mL Intramuscular Once    PRN meds: ALPRAZolam, HYDROmorphone  (DILAUDID ) injection, ondansetron  **OR** ondansetron  (ZOFRAN ) IV, oxyCODONE , polyethylene glycol, prochlorperazine   Infusions:     Antimicrobials: Anti-infectives (From admission, onward)    None       Objective: Vitals:   12/04/23 0741 12/04/23 1115  BP: (!) 156/81 114/81  Pulse: (!) 42 (!) 52  Resp: 19 18  Temp: 97.8 F (36.6 C) 97.9 F (36.6 C)  SpO2: 95% 95%    Intake/Output Summary (Last 24 hours) at 12/04/2023 1255 Last data filed at 12/03/2023 1600 Gross per 24 hour  Intake --  Output 800 ml  Net -800 ml   Filed Weights   12/01/23 2231  Weight: 107.5 kg   Weight change:  Body mass index is 30.43 kg/m.   Physical Exam: General exam: Pleasant, middle-aged Caucasian male.  Not in pain.   Skin: No rashes, lesions or ulcers. HEENT: Sutured wound on the right anterior forehead.  Dried blood on the face Lungs: Clear to auscultation bilaterally,  CVS: S1, S2, no murmur,   GI/Abd: Soft, nontender, nondistended, bowel sound present, old scar from previous surgery CNS: Somnolent. Drowsy from the influence of Zofran  and Compazine Psychiatry: Mood appropriate Extremities: No pedal edema, no calf tenderness  Data Review: I have personally reviewed the laboratory data and studies available.  F/u labs ordered Unresulted Labs (From admission, onward)    None       Signed, Landon FORBES Baller, MD [facility] 12/04/2023

## 2023-12-04 NOTE — Progress Notes (Signed)
 Physical Therapy Treatment Patient Details Name: [name] MRN: [id number] DOB: Nov 11, 1965 Today's Date: 12/04/2023   History of Present Illness Patient is a 58 y/o male admitted 12/01/23 found down on driveway by wife.  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  CT c-spine positive for nondisplaced C4 spinous process fx.  PMH HTN, chronic back pain, GERD, h/o L nephrectomy and recent work up for dysautonomia and orthostatic hypotension.    PT Comments  The pt continues to deny any further episodes of dizziness/vertigo, even with turning and nodding his head while ambulating this session. He also is not necessarily needing a RW for balance but is preferring one due to his fear of falling and to try to help him recover during moments he does become symptomatic when his BP drops. He was able to ambulate without LOB while performing dynamic gait challenges. Educated pt and wife on pt changing positions slowly and perform AROM of extremities and other exercises to increase BP and monitoring himself for symptoms to indicate when to sit or lay down to prevent falls and allow BP to recover, to use TED hose as directed by MD only when mobilizing and not when resting supine, benefits of rollator vs RW for balance and providing a place to sit or stabilize himself if he becomes symptomatic, signs/symptoms of mild TBI (handout already present in room), and abstaining from alcohol if able for his safety and medical stability unless indicated otherwise by MD. They verbalized understanding. Will continue to follow acutely.   SpO2 >/= 90% on RA throughout (on 2L supine when sleeping throughout day due to reports of SpO2 dropping when sleeping)  BP -  119/93 (102) supine 116/70 (81) sitting 102/68 (78) standing 117/60 (74) standing ~3 min 95/73 (80)  standing after ambulating    If plan is discharge home, recommend the following: Help with stairs or ramp for entrance;Assist for transportation;Assistance with cooking/housework;Direct supervision/assist for medications management;Direct supervision/assist for financial management;Supervision due to cognitive status   Can travel by private vehicle        Equipment Recommendations  Rolling walker (2 wheels)    Recommendations for Other Services       Precautions / Restrictions Precautions Precautions: Fall Recall of Precautions/Restrictions: Intact Precaution/Restrictions Comments: monitor BP, orthostatic, watch SpO2 Required Braces or Orthoses:  (no brace and no restrictions for C4 SP fx per Dr. Guilford Shannahan on 12/03/23) Restrictions Weight Bearing Restrictions Per Provider Order: No     Mobility  Bed Mobility Overal bed mobility: Modified Independent Bed Mobility: Supine to Sit, Sit to Supine     Supine to sit: HOB elevated, Modified independent (Device/Increase time) Sit to supine: Modified independent (Device/Increase time), HOB elevated   General bed mobility comments: HOB elevated, no assistance needed    Transfers Overall transfer level: Needs assistance Equipment used: Rolling walker (2 wheels) Transfers: Sit to/from Stand Sit to Stand: Supervision           General transfer comment: Pt able to stand from EOB without LOB, supervision for safety    Ambulation/Gait Ambulation/Gait assistance: Contact guard assist, Supervision Gait Distance (Feet): 200 Feet Assistive device: Rolling walker (2 wheels), None Gait Pattern/deviations: Step-through pattern, Decreased stride length Gait velocity: reduced Gait velocity interpretation: >2.62 ft/sec, indicative of community ambulatory   General Gait Details: Pt ambulated the majority of the distance with the RW, reporting fear  of falling without it, but capable of walking in and out of the bathroom at the end without UE  support and no LOB. Cued pt to turn his head L <> R, nod his head up <> down, change directions, change speeds, and step over an obstacle without noted LOB when using RW, CGA-supervision for safety.   Stairs             Wheelchair Mobility     Tilt Bed    Modified Rankin (Stroke Patients Only)       Balance Overall balance assessment: Needs assistance Sitting-balance support: No upper extremity supported, Feet supported Sitting balance-Leahy Scale: Good Sitting balance - Comments: seated EOB, no overt LOB noted   Standing balance support: No upper extremity supported, During functional activity, Bilateral upper extremity supported Standing balance-Leahy Scale: Fair Standing balance comment: Capable of ambulating without UE support, prefers to use RW though                            Communication Communication Communication: No apparent difficulties  Cognition Arousal: Lethargic, Alert (lethargic when supine) Behavior During Therapy: WFL for tasks assessed/performed   PT - Cognitive impairments: Memory, Attention, Rancho level                   Rancho Levels of Cognitive Functioning Rancho Los Amigos Scales of Cognitive Functioning: Purposeful, Appropriate: Stand-by Assistance on Request Rancho Mirant Scales of Cognitive Functioning: Purposeful, Appropriate: Stand-by Assistance on Request [IX] PT - Cognition Comments: Pt lethargic, falling asleep often when supine. Alert once upright. Has been sleeping on and off all day per wife. Delayed processing and response noted with pt sometimes asking for cues to be repeated due to poor attention, could be impacted by lethargy Following commands: Intact      Cueing Cueing Techniques: Verbal cues  Exercises      General Comments General comments (skin integrity, edema, etc.): SpO2 >/= 90% on RA throughout; BP - 119/93 (102) supine, 116/70 (81) sitting, 102/68 (78) standing, 117/60 (74) standing ~3 min,  95/73 (80) standing after ambulating; Educated pt and wife on pt changing positions slowly and perform AROM of extremities and other exercises to increase BP and monitoring himself for symptoms to indicate when to sit or lay down to prevent falls and allow BP to recover, to use TED hose as directed by MD only when mobilizing and not when resting supine, benefits of rollator vs RW for balance and providing a place to sit or stabilize himself if he becomes symptomatic, signs/symptoms of mild TBI (handout already present in room), and abstaining from alcohol if able for his safety and medical stability unless indicated otherwise by MD. They verbalized understanding.      Pertinent Vitals/Pain Pain Assessment Pain Assessment: Faces Faces Pain Scale: No hurt Pain Intervention(s): Monitored during session    Home Living                          Prior Function            PT Goals (current goals can now be found in the care plan section) Acute Rehab PT Goals Patient Stated Goal: return home PT Goal Formulation: With patient/family Time For Goal Achievement: 12/09/23 Potential to Achieve Goals: Good Progress towards PT goals: Progressing toward goals    Frequency    Min 3X/week      PT Plan  Co-evaluation              AM-PAC PT 6 Clicks Mobility   Outcome Measure  Help needed turning from your back to your side while in a flat bed without using bedrails?: A Little Help needed moving from lying on your back to sitting on the side of a flat bed without using bedrails?: A Little Help needed moving to and from a bed to a chair (including a wheelchair)?: A Little Help needed standing up from a chair using your arms (e.g., wheelchair or bedside chair)?: A Little Help needed to walk in hospital room?: A Little Help needed climbing 3-5 steps with a railing? : A Little 6 Click Score: 18    End of Session Equipment Utilized During Treatment: Gait  belt;Oxygen Activity Tolerance: Patient tolerated treatment well Patient left: in bed;with call bell/phone within reach;with family/visitor present;with bed alarm set Nurse Communication: Mobility status;Other (comment) (vitals) PT Visit Diagnosis: Other abnormalities of gait and mobility (R26.89);History of falling (Z91.81);Other symptoms and signs involving the nervous system (R29.898);Unsteadiness on feet (R26.81)     Time: 8397-8365 PT Time Calculation (min) (ACUTE ONLY): 32 min  Charges:    $Gait Training: 8-22 mins $Therapeutic Activity: 8-22 mins PT General Charges $$ ACUTE PT VISIT: 1 Visit                     Theo Ferretti, PT, DPT Acute Rehabilitation Services  Office: [phone]    Theo CHRISTELLA Ferretti 12/04/2023, 5:06 PM

## 2023-12-04 NOTE — Progress Notes (Signed)
 PT Cancellation Note  Patient Details Name: [name] MRN: [id number] DOB: 05-29-1965   Cancelled Treatment:    Reason Eval/Treat Not Completed: Other (comment) (pt eating lunch and declined therapy at this time (reportedly falling asleep throughout food and working on it for >30 min). pt and family aware this therapist will not be able to return this date)   [name] 12/04/2023, 1:31 PM [name], PT Acute Rehabilitation Services Office: [phone]

## 2023-12-04 NOTE — Plan of Care (Signed)

## 2023-12-05 ENCOUNTER — Inpatient Hospital Stay (HOSPITAL_COMMUNITY)

## 2023-12-05 DIAGNOSIS — S065XAA Traumatic subdural hemorrhage with loss of consciousness status unknown, initial encounter: Secondary | ICD-10-CM | POA: Diagnosis not present

## 2023-12-05 LAB — COMPREHENSIVE METABOLIC PANEL WITH GFR
ALT: 25 U/L (ref 0–44)
AST: 21 U/L (ref 15–41)
Albumin: 2.9 g/dL — ABNORMAL LOW (ref 3.5–5.0)
Alkaline Phosphatase: 72 U/L (ref 38–126)
Anion gap: 12 (ref 5–15)
BUN: 11 mg/dL (ref 6–20)
CO2: 22 mmol/L (ref 22–32)
Calcium: 8.6 mg/dL — ABNORMAL LOW (ref 8.9–10.3)
Chloride: 97 mmol/L — ABNORMAL LOW (ref 98–111)
Creatinine, Ser: 0.92 mg/dL (ref 0.61–1.24)
GFR, Estimated: >60 mL/min (ref >60)
Glucose, Bld: 130 mg/dL — ABNORMAL HIGH (ref 70–99)
Potassium: 3.7 mmol/L (ref 3.5–5.1)
Sodium: 131 mmol/L — ABNORMAL LOW (ref 135–145)
Total Bilirubin: 1.2 mg/dL (ref 0.0–1.2)
Total Protein: 6.1 g/dL — ABNORMAL LOW (ref 6.5–8.1)

## 2023-12-05 LAB — MRSA NEXT GEN BY PCR, NASAL: MRSA by PCR Next Gen: NOT DETECTED

## 2023-12-05 LAB — URINALYSIS, ROUTINE W REFLEX MICROSCOPIC
Bilirubin Urine: NEGATIVE
Glucose, UA: NEGATIVE mg/dL
Hgb urine dipstick: NEGATIVE
Ketones, ur: NEGATIVE mg/dL
Leukocytes,Ua: NEGATIVE
Nitrite: NEGATIVE
Protein, ur: NEGATIVE mg/dL
Specific Gravity, Urine: 1.019 (ref 1.005–1.030)
pH: 5 (ref 5.0–8.0)

## 2023-12-05 LAB — CBC
HCT: 41.3 % (ref 39.0–52.0)
Hemoglobin: 14.3 g/dL (ref 13.0–17.0)
MCH: 34.9 pg — ABNORMAL HIGH (ref 26.0–34.0)
MCHC: 34.6 g/dL (ref 30.0–36.0)
MCV: 100.7 fL — ABNORMAL HIGH (ref 80.0–100.0)
Platelets: 237 K/uL (ref 150–400)
RBC: 4.1 MIL/uL — ABNORMAL LOW (ref 4.22–5.81)
RDW: 12.1 % (ref 11.5–15.5)
WBC: 18.1 K/uL — ABNORMAL HIGH (ref 4.0–10.5)
nRBC: 0 % (ref 0.0–0.2)

## 2023-12-05 LAB — AMMONIA: Ammonia: 46 umol/L — ABNORMAL HIGH (ref 9–35)

## 2023-12-05 LAB — PROCALCITONIN: Procalcitonin: 0.19 ng/mL

## 2023-12-05 MED ORDER — SODIUM CHLORIDE 0.9 % IV SOLN: INTRAVENOUS | Status: DC

## 2023-12-05 NOTE — Progress Notes (Signed)
 PROGRESS NOTE  [name]  DOB: [birth year]  PCP: Daniel Isles, MD FMW:[id number]  DOA: 12/01/2023  LOS: 3 days  Hospital Day: 5  Subjective: Patient was seen and examined this morning. He was very sleepy and was dozing in and out of my conversation with him. Wife at bedside states that his headaches have become severe and that patient has been sleeping most of yesterday.  And is alert and oriented when he is awake but seems to sleep very frequently during my conversation. States that his headaches are located in the right region of his head and has slowly become worse.  He had some nausea yesterday but no vomiting noted.  He denies fevers and chills. Patient walked with physical therapy yesterday and wife noted that his blood pressure dropped with ambulation.  Brief narrative: [name] is a 58 y.o. male with PMH significant for HTN, chronic back pain, GERD, anxiety, h/o left nephrectomy, recurrent syncope, falls secondary to dysautonomia and orthostatic hypotension. 11/4, patient was brought to the ED at [facility] by EMS after an unwitnessed fall.  Per report, patient had 1 drink at his neighbors house, walked back to his garage and fell.  He was later found by his wife on the floor covered in blood, called EMS. EMS noted right forehead scalp laceration, hematoma. EMS put him on a c-collar and brought to ED.  On arrival, patient was hypothermic at 94 degrees, warming intervention implemented Other vital signs were normal. On initial Triage, he was frequently repeating himself.  Noted to have approximately 5 to 6 mm laceration to left forehead that required sutures. Initial labs with WC count 16.6, hemoglobin 16.6, sodium 132, renal function normal, lactic acid 4.5, CK troponin normal, blood alcohol level elevated to 85 UDS positive for benzodiazepine Urinalysis normal Chest x-ray, x-ray pelvis were normal CT head showed 1. Acute bilateral convexity subdural hematomas,  L>R, with associated interhemispheric/ para falcine hemorrhage and possible trace 1-2 mm rightward shift. 2. Small subarachnoid hemorrhage at the right frontal vertex. 3. Globular extra-axial collections at the right vertex, possibly focal subdural or subarachnoid blood, measuring up to 8 mm. 4. Moderate right forehead scalp laceration and hematoma.  CT cervical spine showed - Questionable nondisplaced C4 spinous process fracture - Trace anterolisthesis at C4 on C5 and C5 and C6 - Moderate degenerative changes at C5-C6 and C6-C7  CT scan maxillofacial showed - acute mildly depressed nasal bone fracture.   - Moderate right forehead laceration and hematoma with moderate-to-marked right periorbital hematoma.   CT chest, abdomen and pelvis did not show any traumatic injury but showed diffuse esophageal wall thickening with mild surrounding inflammation descending small paraesophageal lymph nodes suggestive of esophagitis  Patient was given IV pain control, IV hydration Trauma surgeon on-call Dr. Lyndel was contacted who recommended admitting patient to TRH service at [facility] per   Neurosurgery (Dr. Colon) was consulted and recommended admitting patient to [facility] and to repeat CTA head in 12 hours.  Because of inpatient bed at [facility] not being available, patient was transferred from [facility] ED to [facility] ED Admitted to [facility] 11/5, repeat CT head showed slight interval decrease in size of right SDH.  Stable appearance of left SDH and SAH  Assessment and plan: Traumatic head, neck, facial injury -Bilateral subdural hematoma (L > R ) -Subarachnoid hemorrhage -Nondisplaced C4 spinous process fracture -Right forehead scalp laceration and hematoma Secondary to fall at home Has h/o recurrent syncope and fall secondary to  dysautonomia and orthostatic hypotension.  Was also under the influence of alcohol at the time.  UDS positive for benzodiazepine.  Patient uses Xanax as needed at  home Trauma surgery and neurosurgery were consulted. CT head was stable SDH and SAH.  No surgical intervention per neurosurgery. Regarding cervical spine findings, per Dr. Colon, in absence of cervical tenderness he would not suggest surgical intervention or even c-collar.  Continue PT eval Current pain regimen --- Scheduled: Tylenol  1 g 3 times daily, Robaxin oral 1000 mg every 8 hours --- PRN: IV Dilaudid  0.5 mg every 4 hours, oral oxycodone  5 to 10 mg every 4 hours 11/8--would obtain a CT scan to evaluate SDH and SAH in the setting of excessive sleepiness and worsening headaches.  H/o recurrent syncope, fall Dysautonomia/orthostatic hypotension Has been struggling with this for several years.  Because of episodic rise in blood pressure, he has been taking amlodipine and lisinopril as well but at times his blood pressure dips down significantly and he passes out.  He follows with PCP and cardiologist.   Currently being monitored in telemetry. 11/5, orthostatic vital signs taken, did not show drop (see below) Echo 10/5 with EF 65 to 70%, no WMA, G1 DD, normal size and function, RV PTA meds- amlodipine 5 mg nightly, lisinopril 20 mg daily Currently both on hold.  Heart rate and blood pressure have remained in normal range. Home med list also shows sildenafil as needed for ED. I would suggest to not use it given recurrent hypotension and syncope Vitals:   12/03/23 0830 12/03/23 1128 12/03/23 1532 12/03/23 2029  BP: 137/84 126/77 (!) 143/98 (!) 160/89   12/04/23 0032 12/04/23 0424 12/04/23 0741 12/04/23 1115  BP: (!) 160/86 (!) 146/85 (!) 156/81 114/81   12/04/23 2005 12/04/23 2324 12/05/23 0412 12/05/23 0754  BP: (!) 155/92 (!) 157/80 129/63 113/81     Leukocytosis Initially leukocytosis suspected to be due to acute distress however WBC count has bumped up to 18 Will start as sepsis workup, obtain chest x-ray, procalcitonin, MRSA,  urinalysis, blood cultures.  Hold off on antibiotics  initiation for now.  Monitor labs closely. lactic acidosis-resolved   Possible OSA Nocturnal hypoxia Patient is pending outpatient sleep study.  oxygen level was down to 80s multiple occasions and she started on low-flow oxygen. nocturnal pulse oximetry indicates that patient qualifies for supplemental oxygen at discharge  Esophagitis CT chest, abdomen and pelvis did not show any traumatic injury but showed diffuse esophageal wall thickening with mild surrounding inflammation descending small paraesophageal lymph nodes suggestive of esophagitis. Patient endorses longstanding history of reflux and takes Prilosec intermittently. Reports colonoscopy in the past with GI attending but no endoscopy.  Would benefit from GI evaluation as an outpatient  H/o diverticulosis S/p colectomy, colostomy reversal Continue bowel regimen  Alcohol intoxication Alcohol level was 285.  Patient and wife state that he very occasionally drinks.  On the day of presentation, he gave company to his neighbor and just took 1 drink just prior to fall.  No sign of any withdrawal symptoms at this time   Hyponatremia Sodium drifted down to 131 in the setting of poor oral intake.  Will start a trial of IV fluids at 75 cc for 1 day. Repeat labs in the morning. Recent Labs  Lab 12/01/23 2231 12/02/23 0427 12/03/23 0231 12/05/23 0858  NA 132* 136 135 131*   Chronic back pain  anxiety/depression PTA meds- BuSpar 10 mg mg/15 mg mg -am/pm, fluoxetine 60 mg daily, Seroquel 50  mg nightly, Xanax 1 mg daily as needed, Neurontin nightly as needed Cardiology were on hold while somnolent  COPD Bronchodilators.  Left knee pain 11/5, left knee x-ray showed possible foreign body adjacent to the lateral patella. On my exam this morning, patient has a healing scab on the left, but no presence of foreign body  h/o left nephrectomy After trauma at the age of 59   Mobility: PT eval  PT Orders: Active   PT Follow up  Rec: Outpatient Pt (Vestibular/Neurologic Rehab)12/04/2023 1600   Goals of care   Code Status: Full Code     DVT prophylaxis:  SCDs Start: 12/02/23 0410   Antimicrobials: None Fluid: Started on IV fluids due to hyponatremia Consultants: Trauma surgery, neurosurgery Family Communication: Wife at bedside  Status: Inpatient Level of care:  Progressive   Patient is from: Home Needs to continue in-hospital care: Continue monitor for mental status change.  If stable, can probably discharge home tomorrow   Diet:  Diet Order             Diet regular Room service appropriate? Yes; Fluid consistency: Thin  Diet effective now                   Scheduled Meds:  acetaminophen   1,000 mg Oral Q8H   busPIRone  10 mg Oral q morning   And   busPIRone  15 mg Oral QPM   FLUoxetine  60 mg Oral Daily   methocarbamol  1,000 mg Oral Q8H   pantoprazole  40 mg Oral Daily   pneumococcal 20-valent conjugate vaccine  0.5 mL Intramuscular Tomorrow-1000   QUEtiapine  50 mg Oral QHS   senna-docusate  1 tablet Oral QHS   Tdap  0.5 mL Intramuscular Once    PRN meds: ALPRAZolam, HYDROmorphone  (DILAUDID ) injection, ondansetron  **OR** ondansetron  (ZOFRAN ) IV, oxyCODONE , polyethylene glycol, prochlorperazine   Infusions:     Antimicrobials: Anti-infectives (From admission, onward)    None       Objective: Vitals:   12/05/23 0412 12/05/23 0754  BP: 129/63 113/81  Pulse: 85 64  Resp: 20 19  Temp: 98.2 F (36.8 C) 98.2 F (36.8 C)  SpO2: 91% 93%    Intake/Output Summary (Last 24 hours) at 12/05/2023 1014 Last data filed at 12/05/2023 0700 Gross per 24 hour  Intake 240 ml  Output --  Net 240 ml   Filed Weights   12/01/23 2231  Weight: 107.5 kg   Weight change:  Body mass index is 30.43 kg/m.   Physical Exam: General exam: Pleasant, middle-aged Caucasian male.    Skin: No rashes, lesions or ulcers. HEENT: Sutured wound on the right anterior forehead.  Dried blood on  the face Lungs: Clear to auscultation bilaterally,  CVS: S1, S2, no murmur,   GI/Abd: Soft, nontender, nondistended, bowel sound present, old scar from previous surgery CNS: Somnolent. Drowsy from the influence of Zofran  and Compazine Psychiatry: Mood appropriate Extremities: No pedal edema, no calf tenderness  Data Review: I have personally reviewed the laboratory data and studies available.  F/u labs ordered Unresulted Labs (From admission, onward)    None       Signed, Landon FORBES Baller, MD [facility] 12/05/2023

## 2023-12-05 NOTE — Plan of Care (Signed)

## 2023-12-05 NOTE — Plan of Care (Signed)

## 2023-12-06 DIAGNOSIS — S065XAA Traumatic subdural hemorrhage with loss of consciousness status unknown, initial encounter: Secondary | ICD-10-CM | POA: Diagnosis not present

## 2023-12-06 LAB — BLOOD GAS, ARTERIAL
Acid-Base Excess: 3.5 mmol/L — ABNORMAL HIGH (ref 0.0–2.0)
Bicarbonate: 29.1 mmol/L — ABNORMAL HIGH (ref 20.0–28.0)
O2 Saturation: 98.2 %
Patient temperature: 37
pCO2 arterial: 47 mmHg (ref 32–48)
pH, Arterial: 7.4 (ref 7.35–7.45)
pO2, Arterial: 76 mmHg — ABNORMAL LOW (ref 83–108)

## 2023-12-06 LAB — CBC WITH DIFFERENTIAL/PLATELET
Abs Immature Granulocytes: 0.12 K/uL — ABNORMAL HIGH (ref 0.00–0.07)
Basophils Absolute: 0.1 K/uL (ref 0.0–0.1)
Basophils Relative: 1 %
Eosinophils Absolute: 0.9 K/uL — ABNORMAL HIGH (ref 0.0–0.5)
Eosinophils Relative: 5 %
HCT: 40.9 % (ref 39.0–52.0)
Hemoglobin: 14.1 g/dL (ref 13.0–17.0)
Immature Granulocytes: 1 %
Lymphocytes Relative: 17 %
Lymphs Abs: 2.8 K/uL (ref 0.7–4.0)
MCH: 34.7 pg — ABNORMAL HIGH (ref 26.0–34.0)
MCHC: 34.5 g/dL (ref 30.0–36.0)
MCV: 100.7 fL — ABNORMAL HIGH (ref 80.0–100.0)
Monocytes Absolute: 2 K/uL — ABNORMAL HIGH (ref 0.1–1.0)
Monocytes Relative: 12 %
Neutro Abs: 10.9 K/uL — ABNORMAL HIGH (ref 1.7–7.7)
Neutrophils Relative %: 64 %
Platelets: 265 K/uL (ref 150–400)
RBC: 4.06 MIL/uL — ABNORMAL LOW (ref 4.22–5.81)
RDW: 12.1 % (ref 11.5–15.5)
WBC: 16.7 K/uL — ABNORMAL HIGH (ref 4.0–10.5)
nRBC: 0 % (ref 0.0–0.2)

## 2023-12-06 LAB — COMPREHENSIVE METABOLIC PANEL WITH GFR
ALT: 24 U/L (ref 0–44)
AST: 20 U/L (ref 15–41)
Albumin: 2.8 g/dL — ABNORMAL LOW (ref 3.5–5.0)
Alkaline Phosphatase: 71 U/L (ref 38–126)
Anion gap: 10 (ref 5–15)
BUN: 10 mg/dL (ref 6–20)
CO2: 26 mmol/L (ref 22–32)
Calcium: 8.5 mg/dL — ABNORMAL LOW (ref 8.9–10.3)
Chloride: 96 mmol/L — ABNORMAL LOW (ref 98–111)
Creatinine, Ser: 0.95 mg/dL (ref 0.61–1.24)
GFR, Estimated: >60 mL/min (ref >60)
Glucose, Bld: 115 mg/dL — ABNORMAL HIGH (ref 70–99)
Potassium: 4 mmol/L (ref 3.5–5.1)
Sodium: 132 mmol/L — ABNORMAL LOW (ref 135–145)
Total Bilirubin: 0.9 mg/dL (ref 0.0–1.2)
Total Protein: 6.2 g/dL — ABNORMAL LOW (ref 6.5–8.1)

## 2023-12-06 LAB — BRAIN NATRIURETIC PEPTIDE: B Natriuretic Peptide: 50 pg/mL (ref 0.0–100.0)

## 2023-12-06 MED ORDER — METHOCARBAMOL 750 MG PO TABS
750.0000 mg | ORAL_TABLET | Freq: Three times a day (TID) | ORAL | Status: DC
  Administered 2023-12-06 – 2023-12-12 (×19): 750 mg via ORAL
  Filled 2023-12-06 (×19): qty 1

## 2023-12-06 MED ORDER — UNJURY VANILLA POWDER
2.0000 [oz_av] | Freq: Four times a day (QID) | ORAL | Status: DC
  Administered 2023-12-07: 2 [oz_av] via ORAL
  Filled 2023-12-06 (×12): qty 27

## 2023-12-06 MED ORDER — HYDROMORPHONE HCL 1 MG/ML IJ SOLN
0.2500 mg | INTRAMUSCULAR | Status: DC | PRN
  Administered 2023-12-06 – 2023-12-07 (×4): 0.25 mg via INTRAVENOUS
  Filled 2023-12-06 (×4): qty 0.5

## 2023-12-06 MED ORDER — PROCHLORPERAZINE EDISYLATE 10 MG/2ML IJ SOLN
5.0000 mg | Freq: Four times a day (QID) | INTRAMUSCULAR | Status: DC | PRN
  Administered 2023-12-10 – 2023-12-11 (×3): 5 mg via INTRAVENOUS
  Filled 2023-12-06 (×4): qty 2

## 2023-12-06 MED ORDER — LACTULOSE 10 GM/15ML PO SOLN
10.0000 g | Freq: Three times a day (TID) | ORAL | Status: DC
  Administered 2023-12-06 – 2023-12-12 (×15): 10 g via ORAL
  Filled 2023-12-06 (×18): qty 30

## 2023-12-06 NOTE — Plan of Care (Signed)
  Problem: Health Behavior/Discharge Planning: Goal: Ability to manage health-related needs will improve Outcome: Progressing   Problem: Clinical Measurements: Goal: Will remain free from infection Outcome: Progressing   Problem: Clinical Measurements: Goal: Respiratory complications will improve Outcome: Progressing   Problem: Activity: Goal: Risk for activity intolerance will decrease Outcome: Progressing   Problem: Nutrition: Goal: Adequate nutrition will be maintained Outcome: Progressing   Problem: Coping: Goal: Level of anxiety will decrease Outcome: Progressing   Problem: Elimination: Goal: Will not experience complications related to bowel motility Outcome: Progressing   Problem: Elimination: Goal: Will not experience complications related to urinary retention Outcome: Progressing   Problem: Pain Managment: Goal: General experience of comfort will improve and/or be controlled Outcome: Progressing   Problem: Safety: Goal: Ability to remain free from injury will improve Outcome: Progressing   Problem: Skin Integrity: Goal: Risk for impaired skin integrity will decrease Outcome: Progressing

## 2023-12-06 NOTE — Plan of Care (Signed)

## 2023-12-06 NOTE — Progress Notes (Signed)
 ABG sample obtained and sent to lab at this time, RT called and notified lab prior to sending ABG

## 2023-12-06 NOTE — Progress Notes (Signed)
 PROGRESS NOTE  [name]  DOB: 1965-06-10  PCP: Maree Isles, MD FMW:[id number]  DOA: 12/01/2023  LOS: 4 days  Hospital Day: 6  Subjective: Patient was seen and examined this morning. He was very sleepy and was dozing in and out of my conversation with him. Wife at bedside states that his headaches have become severe and that patient has been sleeping most of yesterday.   He is alert and oriented when he is awake but seems to sleep very frequently during my conversation. Patient states that his headaches are located in the right region of his head and has slowly become worse.  He had some nausea yesterday but no vomiting noted.  He denies fevers and chills. Patient walked with physical therapy yesterday and wife noted that his blood pressure dropped with ambulation. 11/9--wife is concerned about his sleepiness and worsening headaches.  Patient is alert and oriented x 4. she is concerned about mild cognitive decline.   Brief narrative: [name] is a 58 y.o. male with PMH significant for HTN, chronic back pain, GERD, anxiety, h/o left nephrectomy, recurrent syncope, falls secondary to dysautonomia and orthostatic hypotension. 11/4, patient was brought to the ED at [facility] by EMS after an unwitnessed fall.  Per report, patient had 1 drink at his neighbors house, walked back to his garage and fell.  He was later found by his wife on the floor covered in blood, called EMS. EMS noted right forehead scalp laceration, hematoma. EMS put him on a c-collar and brought to ED.  On arrival, patient was hypothermic at 94 degrees, warming intervention implemented Other vital signs were normal. On initial Triage, he was frequently repeating himself.  Noted to have approximately 5 to 6 mm laceration to left forehead that required sutures. Initial labs with WC count 16.6, hemoglobin 16.6, sodium 132, renal function normal, lactic acid 4.5, CK troponin normal, blood alcohol level elevated  to 85 UDS positive for benzodiazepine Urinalysis normal Chest x-ray, x-ray pelvis were normal CT head showed 1. Acute bilateral convexity subdural hematomas, L>R, with associated interhemispheric/ para falcine hemorrhage and possible trace 1-2 mm rightward shift. 2. Small subarachnoid hemorrhage at the right frontal vertex. 3. Globular extra-axial collections at the right vertex, possibly focal subdural or subarachnoid blood, measuring up to 8 mm. 4. Moderate right forehead scalp laceration and hematoma.  CT cervical spine showed - Questionable nondisplaced C4 spinous process fracture - Trace anterolisthesis at C4 on C5 and C5 and C6 - Moderate degenerative changes at C5-C6 and C6-C7  CT scan maxillofacial showed - acute mildly depressed nasal bone fracture.   - Moderate right forehead laceration and hematoma with moderate-to-marked right periorbital hematoma.   CT chest, abdomen and pelvis did not show any traumatic injury but showed diffuse esophageal wall thickening with mild surrounding inflammation descending small paraesophageal lymph nodes suggestive of esophagitis  Patient was given IV pain control, IV hydration Trauma surgeon on-call Dr. Lyndel was contacted who recommended admitting patient to TRH service at [facility] per   Neurosurgery (Dr. Colon) was consulted and recommended admitting patient to [facility] and to repeat CTA head in 12 hours.  Because of inpatient bed at [facility] not being available, patient was transferred from [facility] ED to [facility] ED Admitted to [facility] 11/5, repeat CT head showed slight interval decrease in size of right SDH.  Stable appearance of left SDH and [facility] 11/8 CT scan obtained due to drowsiness, showed Stable multifocal bilateral subdural hematomas (5 mm  left, 5 mm parafalcine, and small volume subarachnoid hemorrhage. No midline shift or significant intracranial mass effect.   Assessment and plan: Traumatic head, neck, facial  injury -Bilateral subdural hematoma (L > R ) -Subarachnoid hemorrhage -Nondisplaced C4 spinous process fracture -Right forehead scalp laceration and hematoma Secondary to fall at home Has h/o recurrent syncope and fall secondary to dysautonomia and orthostatic hypotension.  Was also under the influence of alcohol at the time.  UDS positive for benzodiazepine.  Patient uses Xanax as needed at home Trauma surgery and neurosurgery were consulted. CT head was stable SDH and SAH.  No surgical intervention per neurosurgery. Regarding cervical spine findings, per Dr. Colon, in absence of cervical tenderness he would not suggest surgical intervention or even c-collar.  Continue PT eval Current pain regimen--weaning down pain medications. --- Scheduled: Tylenol  1 g 3 times daily, Robaxin oral 750mg  every 8 hours --- PRN: IV Dilaudid  0.25 mg every 4 hours, oral oxycodone  5 to 10 mg every 4 hours 11/8--would obtain a CT scan to evaluate SDH and SAH in the setting of excessive sleepiness and worsening headaches. 11/9-SDH and SAH appears stable no acute worsening noted  per CT scan, his headaches persist, will obtain neurology evaluation for assistance in management of his headaches Consulted neurology for assistance   H/o recurrent syncope, fall Dysautonomia/orthostatic hypotension Has been struggling with this for several years.  Because of episodic rise in blood pressure, he has been taking amlodipine and lisinopril as well but at times his blood pressure dips down significantly and he passes out.  He follows with PCP and cardiologist.   Currently being monitored in telemetry. 11/5, orthostatic vital signs taken, did not show drop (see below) Echo 10/5 with EF 65 to 70%, no WMA, G1 DD, normal size and function, RV PTA meds- amlodipine 5 mg nightly, lisinopril 20 mg daily Currently both on hold.  Heart rate and blood pressure have remained in normal range. Home med list also shows sildenafil as needed  for ED. I would suggest to not use it given recurrent hypotension and syncope Vitals:   12/04/23 0741 12/04/23 1115 12/04/23 2005 12/04/23 2324  BP: (!) 156/81 114/81 (!) 155/92 (!) 157/80   12/05/23 0412 12/05/23 0754 12/05/23 1149 12/05/23 1537  BP: 129/63 113/81 128/65 (!) 147/72   12/05/23 2030 12/06/23 0035 12/06/23 0413 12/06/23 0905  BP: 135/81 125/73 (!) 121/94 133/87     Leukocytosis Elevated ammonia levels Initially leukocytosis suspected to be due to acute distress however WBC count has bumped up to 18--16 Negative sepsis workup CXR 11/9-->Low lung volumes with mid and lower lung zone mixed interstitial and airspace opacification, new from 12/01/2023, suggesting edema. Pneumonia not excluded. Possible small left pleural effusion. Negative procalcitonin, MRSA, urinalysis, blood cultures. Hold off on antibiotics initiation for now.   monitor labs closely. Added on lactulose for elevated ammonia levels.  46 lactic acidosis-resolved   Possible OSA Nocturnal hypoxia Patient is pending outpatient sleep study.  oxygen level was down to 80s multiple occasions and she started on low-flow oxygen. nocturnal pulse oximetry indicates that patient qualifies for supplemental oxygen at discharge  Esophagitis CT chest, abdomen and pelvis did not show any traumatic injury but showed diffuse esophageal wall thickening with mild surrounding inflammation descending small paraesophageal lymph nodes suggestive of esophagitis. Patient endorses longstanding history of reflux and takes Prilosec intermittently. Reports colonoscopy in the past with GI attending but no endoscopy.  Would benefit from GI evaluation as an outpatient  H/o diverticulosis S/p colectomy,  colostomy reversal Continue bowel regimen  Alcohol intoxication Alcohol level was 285.  Patient and wife state that he very occasionally drinks.  On the day of presentation, he gave company to his neighbor and just took 1 drink just  prior to fall.  No sign of any withdrawal symptoms at this time   Hyponatremia Sodium drifted down to 131--132 in the setting of poor oral intake. Received IV fluids x 1 day some improvements noted.  Repeat labs in the morning. Recent Labs  Lab 12/01/23 2231 12/02/23 0427 12/03/23 0231 12/05/23 0858 12/06/23 0105  NA 132* 136 135 131* 132*   Chronic back pain  anxiety/depression PTA meds- BuSpar 10 mg mg/15 mg mg -am/pm, fluoxetine 60 mg daily, Seroquel 50 mg nightly, Xanax 1 mg daily as needed, Neurontin nightly as needed Cardiology were on hold while somnolent  COPD Bronchodilators.  Left knee pain 11/5, left knee x-ray showed possible foreign body adjacent to the lateral patella. On my exam this morning, patient has a healing scab on the left, but no presence of foreign body  h/o left nephrectomy After trauma at the age of 89   Mobility: PT eval  PT Orders: Active   PT Follow up Rec: Outpatient Pt (Vestibular/Neurologic Rehab)12/04/2023 1600   Goals of care   Code Status: Full Code     DVT prophylaxis:  SCDs Start: 12/02/23 0410   Antimicrobials: None Fluid: Started on IV fluids due to hyponatremia Consultants: Trauma surgery, neurosurgery Family Communication: Wife at bedside  Status: Inpatient Level of care:  Progressive   Patient is from: Home Needs to continue in-hospital care: Continue monitor for mental status change.  If stable, can probably discharge home tomorrow   Diet:  Diet Order             Diet regular Room service appropriate? Yes; Fluid consistency: Thin  Diet effective now                   Scheduled Meds:  acetaminophen   1,000 mg Oral Q8H   busPIRone  10 mg Oral q morning   And   busPIRone  15 mg Oral QPM   FLUoxetine  60 mg Oral Daily   lactulose  10 g Oral TID   methocarbamol  1,000 mg Oral Q8H   pantoprazole  40 mg Oral Daily   pneumococcal 20-valent conjugate vaccine  0.5 mL Intramuscular Tomorrow-1000   protein  supplement  2 oz Oral QID   QUEtiapine  50 mg Oral QHS   senna-docusate  1 tablet Oral QHS   Tdap  0.5 mL Intramuscular Once    PRN meds: ALPRAZolam, HYDROmorphone  (DILAUDID ) injection, ondansetron  **OR** ondansetron  (ZOFRAN ) IV, oxyCODONE , polyethylene glycol, prochlorperazine   Infusions:   sodium chloride 75 mL/hr at 12/06/23 1040     Antimicrobials: Anti-infectives (From admission, onward)    None       Objective: Vitals:   12/06/23 0413 12/06/23 0905  BP: (!) 121/94 133/87  Pulse: 85 74  Resp: 18 18  Temp: 98.5 F (36.9 C) 98.4 F (36.9 C)  SpO2: 93% 98%    Intake/Output Summary (Last 24 hours) at 12/06/2023 1056 Last data filed at 12/06/2023 1040 Gross per 24 hour  Intake 1832.76 ml  Output 750 ml  Net 1082.76 ml   Filed Weights   12/01/23 2231  Weight: 107.5 kg   Weight change:  Body mass index is 30.43 kg/m.   Physical Exam: General exam: Pleasant, middle-aged Caucasian male.    Skin:  No rashes, lesions or ulcers. HEENT: Sutured wound on the right anterior forehead.  Dried blood on the face Lungs: Clear to auscultation bilaterally,  CVS: S1, S2, no murmur,   GI/Abd: Soft, nontender, nondistended, bowel sound present, old scar from previous surgery CNS: Somnolent. Drowsy from the influence of Zofran  and Compazine Psychiatry: Mood appropriate Extremities: No pedal edema, no calf tenderness  Data Review: I have personally reviewed the laboratory data and studies available.  F/u labs ordered Unresulted Labs (From admission, onward)     Start     Ordered   12/05/23 1024  Legionella Pneumophila Serogp 1 Ur Ag  Once,   R        12/05/23 1024            Signed, Landon FORBES Baller, MD [facility] 12/06/2023

## 2023-12-07 ENCOUNTER — Inpatient Hospital Stay (HOSPITAL_COMMUNITY)

## 2023-12-07 DIAGNOSIS — S065XAA Traumatic subdural hemorrhage with loss of consciousness status unknown, initial encounter: Secondary | ICD-10-CM | POA: Diagnosis not present

## 2023-12-07 LAB — CBC WITH DIFFERENTIAL/PLATELET
Abs Immature Granulocytes: 0.12 K/uL — ABNORMAL HIGH (ref 0.00–0.07)
Basophils Absolute: 0.1 K/uL (ref 0.0–0.1)
Basophils Relative: 1 %
Eosinophils Absolute: 0.5 K/uL (ref 0.0–0.5)
Eosinophils Relative: 3 %
HCT: 40.1 % (ref 39.0–52.0)
Hemoglobin: 13.8 g/dL (ref 13.0–17.0)
Immature Granulocytes: 1 %
Lymphocytes Relative: 14 %
Lymphs Abs: 2.3 K/uL (ref 0.7–4.0)
MCH: 34.4 pg — ABNORMAL HIGH (ref 26.0–34.0)
MCHC: 34.4 g/dL (ref 30.0–36.0)
MCV: 100 fL (ref 80.0–100.0)
Monocytes Absolute: 2 K/uL — ABNORMAL HIGH (ref 0.1–1.0)
Monocytes Relative: 12 %
Neutro Abs: 11.1 K/uL — ABNORMAL HIGH (ref 1.7–7.7)
Neutrophils Relative %: 69 %
Platelets: 282 K/uL (ref 150–400)
RBC: 4.01 MIL/uL — ABNORMAL LOW (ref 4.22–5.81)
RDW: 11.9 % (ref 11.5–15.5)
WBC: 16 K/uL — ABNORMAL HIGH (ref 4.0–10.5)
nRBC: 0 % (ref 0.0–0.2)

## 2023-12-07 LAB — COMPREHENSIVE METABOLIC PANEL WITH GFR
ALT: 25 U/L (ref 0–44)
AST: 23 U/L (ref 15–41)
Albumin: 2.8 g/dL — ABNORMAL LOW (ref 3.5–5.0)
Alkaline Phosphatase: 65 U/L (ref 38–126)
Anion gap: 7 (ref 5–15)
BUN: 8 mg/dL (ref 6–20)
CO2: 25 mmol/L (ref 22–32)
Calcium: 8.6 mg/dL — ABNORMAL LOW (ref 8.9–10.3)
Chloride: 98 mmol/L (ref 98–111)
Creatinine, Ser: 0.78 mg/dL (ref 0.61–1.24)
GFR, Estimated: >60 mL/min (ref >60)
Glucose, Bld: 117 mg/dL — ABNORMAL HIGH (ref 70–99)
Potassium: 3.8 mmol/L (ref 3.5–5.1)
Sodium: 130 mmol/L — ABNORMAL LOW (ref 135–145)
Total Bilirubin: 0.9 mg/dL (ref 0.0–1.2)
Total Protein: 6 g/dL — ABNORMAL LOW (ref 6.5–8.1)

## 2023-12-07 LAB — LEGIONELLA PNEUMOPHILA SEROGP 1 UR AG: L. pneumophila Serogp 1 Ur Ag: NEGATIVE

## 2023-12-07 LAB — OSMOLALITY, URINE: Osmolality, Ur: 850 mosm/kg (ref 300–900)

## 2023-12-07 MED ORDER — PROSOURCE PLUS PO LIQD
30.0000 mL | Freq: Two times a day (BID) | ORAL | Status: DC
  Administered 2023-12-07 – 2023-12-09 (×5): 30 mL via ORAL
  Filled 2023-12-07 (×5): qty 30

## 2023-12-07 MED ORDER — HYDROMORPHONE HCL 1 MG/ML IJ SOLN
0.5000 mg | INTRAMUSCULAR | Status: DC | PRN
  Administered 2023-12-07 – 2023-12-11 (×16): 0.5 mg via INTRAVENOUS
  Filled 2023-12-07 (×16): qty 0.5

## 2023-12-07 MED ORDER — ENSURE MAX PROTEIN PO LIQD
11.0000 [oz_av] | Freq: Two times a day (BID) | ORAL | Status: AC
  Administered 2023-12-07 – 2023-12-10 (×7): 11 [oz_av] via ORAL
  Filled 2023-12-07 (×8): qty 330

## 2023-12-07 NOTE — Progress Notes (Signed)
 Pt is back to unit, check in completed.

## 2023-12-07 NOTE — Progress Notes (Signed)
 Pt is off to unit for CT Head.

## 2023-12-07 NOTE — Progress Notes (Signed)
 Patient ID: [name], male   DOB: 06-Jul-1965, 58 y.o.   MRN: [id number] Vital signs are stable patient complaining of severe headache.  I have reviewed the recent CT and note that there is no change in the size of the subdurals.  I believe he will have to continue treatment conservatively.  Try some low-dose steroids such as Decadron 2 mg twice a day.  This can be tapered after 10 days.  I do believe that we should avoid the coagulants though for the next several weeks until we see some resolution of these hematomas.

## 2023-12-07 NOTE — Progress Notes (Signed)
 Physical Therapy Treatment Patient Details Name: [name] MRN: [id number] DOB: [birth year] Today's Date: 12/07/2023   History of Present Illness Patient is a 58 y/o male admitted 12/01/23 found down on driveway by wife.  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  CT c-spine positive for nondisplaced C4 spinous process fx.  PMH HTN, chronic back pain, GERD, h/o L nephrectomy and recent work up for dysautonomia and orthostatic hypotension.    PT Comments  Pt received in supine and agreeable to session. Pt orthostatic during initial stand with pt reporting slight dizziness, but BP improving within 2 mins. Pt reports no dizziness during ambulation, however reports experiencing intermittent dizziness after trial. Education provided on activity progression and symptom management with seated rest when experiencing symptoms to reduce fall risk. Pt able to increase gait distance using RW and short distance with no AD with up to CGA for safety. Pt reports having SPC at home and requests to trial, so plan to trial next session. Education provided on SpO2 monitoring. Pt continues to benefit from PT services to progress toward functional mobility goals.    If plan is discharge home, recommend the following: Help with stairs or ramp for entrance;Assist for transportation;Assistance with cooking/housework;Direct supervision/assist for medications management;Direct supervision/assist for financial management;Supervision due to cognitive status   Can travel by private vehicle        Equipment Recommendations  Rolling walker (2 wheels)    Recommendations for Other Services       Precautions / Restrictions Precautions Precautions: Fall Recall of Precautions/Restrictions: Intact Precaution/Restrictions Comments: monitor BP, orthostatic, watch  SpO2 Restrictions Weight Bearing Restrictions Per Provider Order: No     Mobility  Bed Mobility Overal bed mobility: Modified Independent Bed Mobility: Supine to Sit, Sit to Supine     Supine to sit: HOB elevated, Modified independent (Device/Increase time) Sit to supine: Modified independent (Device/Increase time), HOB elevated   General bed mobility comments: HOB elevated, no assistance needed    Transfers Overall transfer level: Needs assistance Equipment used: Rolling walker (2 wheels) Transfers: Sit to/from Stand Sit to Stand: Supervision           General transfer comment: From low EOB with cues for hand placement    Ambulation/Gait Ambulation/Gait assistance: Contact guard assist, Supervision Gait Distance (Feet): 250 Feet Assistive device: Rolling walker (2 wheels), None Gait Pattern/deviations: Step-through pattern, Decreased stride length Gait velocity: reduced     General Gait Details: Pt demonstrates low foot clearance with intermittent feet sliding forward with increased fatigue. Cues for upright posture and increased foot clearance. Pt ambulates ~34ft without AD with increased instability, but no overt LOB   Stairs Stairs: Yes Stairs assistance: Contact guard assist Stair Management: One rail Right, Alternating pattern Number of Stairs: 2 General stair comments: CGA for safety   Wheelchair Mobility     Tilt Bed    Modified Rankin (Stroke Patients Only)       Balance Overall balance assessment: Needs assistance Sitting-balance support: No upper extremity supported, Feet supported Sitting balance-Leahy Scale: Good Sitting balance - Comments: sitting EOB   Standing balance support: No upper extremity supported, During functional activity, Bilateral upper extremity supported Standing balance-Leahy Scale: Fair Standing balance comment: Increased instability without RW, but no overt LOB  Communication  Communication Communication: No apparent difficulties  Cognition Arousal: Lethargic, Alert (lethargic in supine) Behavior During Therapy: WFL for tasks assessed/performed   PT - Cognitive impairments: Memory, Attention, Rancho level                   Rancho Levels of Cognitive Functioning Rancho Los Amigos Scales of Cognitive Functioning: Purposeful, Appropriate: Stand-by Assistance on Request Rancho Mirant Scales of Cognitive Functioning: Purposeful, Appropriate: Stand-by Assistance on Request [IX]   Following commands: Intact      Cueing Cueing Techniques: Verbal cues  Exercises General Exercises - Lower Extremity Long Arc Quad: AROM, Seated, Both, 5 reps    General Comments General comments (skin integrity, edema, etc.): SpO2 95% on RA at rest and remaining at 90-93% during ambulation. SpO2 noted to drop to 89% intermittently in supine on RA at end of session, so O2 nasal cannula donned. BP 127/82 sitting EOB after ambulation      Pertinent Vitals/Pain Pain Assessment Pain Assessment: Faces Faces Pain Scale: Hurts even more Pain Location: headache Pain Descriptors / Indicators: Headache Pain Intervention(s): Monitored during session, Limited activity within patient's tolerance, Repositioned     PT Goals (current goals can now be found in the care plan section) Acute Rehab PT Goals Patient Stated Goal: return home PT Goal Formulation: With patient/family Time For Goal Achievement: 12/09/23 Progress towards PT goals: Progressing toward goals    Frequency    Min 3X/week       AM-PAC PT 6 Clicks Mobility   Outcome Measure  Help needed turning from your back to your side while in a flat bed without using bedrails?: None Help needed moving from lying on your back to sitting on the side of a flat bed without using bedrails?: None Help needed moving to and from a bed to a chair (including a wheelchair)?: A Little Help needed standing up from a chair  using your arms (e.g., wheelchair or bedside chair)?: A Little Help needed to walk in hospital room?: A Little Help needed climbing 3-5 steps with a railing? : A Little 6 Click Score: 20    End of Session Equipment Utilized During Treatment: Gait belt Activity Tolerance: Patient tolerated treatment well Patient left: in bed;with call bell/phone within reach;with family/visitor present;with bed alarm set Nurse Communication: Mobility status;Other (comment) (vitals) PT Visit Diagnosis: Other abnormalities of gait and mobility (R26.89);History of falling (Z91.81);Other symptoms and signs involving the nervous system (R29.898);Unsteadiness on feet (R26.81)     Time: 8949-8865 PT Time Calculation (min) (ACUTE ONLY): 44 min  Charges:    $Gait Training: 23-37 mins $Therapeutic Activity: 8-22 mins PT General Charges $$ ACUTE PT VISIT: 1 Visit                     Darryle George, PTA Acute Rehabilitation Services Secure Chat Preferred  [phone]) [phone]    Darryle George 12/07/2023, 1:17 PM

## 2023-12-07 NOTE — Plan of Care (Signed)
  Problem: Education: Goal: Knowledge of General Education information will improve Description: Including pain rating scale, medication(s)/side effects and non-pharmacologic comfort measures Outcome: Progressing   Problem: Health Behavior/Discharge Planning: Goal: Ability to manage health-related needs will improve Outcome: Progressing   Problem: Clinical Measurements: Goal: Will remain free from infection Outcome: Progressing   Problem: Clinical Measurements: Goal: Respiratory complications will improve Outcome: Progressing   Problem: Activity: Goal: Risk for activity intolerance will decrease Outcome: Progressing   Problem: Nutrition: Goal: Adequate nutrition will be maintained Outcome: Progressing   Problem: Coping: Goal: Level of anxiety will decrease Outcome: Progressing   Problem: Elimination: Goal: Will not experience complications related to bowel motility Outcome: Progressing   Problem: Pain Managment: Goal: General experience of comfort will improve and/or be controlled Outcome: Progressing   Problem: Safety: Goal: Ability to remain free from injury will improve Outcome: Progressing   Problem: Skin Integrity: Goal: Risk for impaired skin integrity will decrease Outcome: Progressing

## 2023-12-07 NOTE — Progress Notes (Signed)
 OT Cancellation Note  Patient Details Name: [name] MRN: [id number] DOB: [birth year]   Cancelled Treatment:    Reason Eval/Treat Not Completed: Other (comment) Patient working with SLP upon OT attempt. OT will follow back as time permits.   Ronal Gift E. Daylyn Christine, OTR/L Acute Rehabilitation Services [phone]   Ronal Gift Salt 12/07/2023, 2:51 PM

## 2023-12-07 NOTE — Progress Notes (Signed)
 Pt still complain of headache 10/10 even after giving prn meds, stated given meds did not provide relief. Informed MD Mansy, ordered stat CTHead, advised to inform Surgery.  9964 Informed NeuroSurgery thru Amion, updates pt's concern. 0038 NeuroSx returned the call, talked to MD [facility], update her that pt will be for [facility]. No order were made.

## 2023-12-07 NOTE — TOC Progression Note (Signed)
 Transition of Care [facility]) - Progression Note    Patient Details  Name: [name] MRN: [id number] Date of Birth: 30-Aug-1965  Transition of Care [facility]) CM/SW Contact  Andrez JULIANNA George, RN Phone Number: 12/07/2023, 2:01 PM  Clinical Narrative:     Per Pt he qualified for home oxygen at night. CM will need this report so that the oxygen company can get it approved through his insurance.  IP Care management following.  Expected Discharge Plan: OP Rehab Barriers to Discharge: Continued Medical Work up               Expected Discharge Plan and Services   Discharge Planning Services: CM Consult   Living arrangements for the past 2 months: Single Family Home                 DME Arranged: Walker rolling DME Agency: [employer] Date DME Agency Contacted: 12/04/23 Time DME Agency Contacted: 1220 Representative spoke with at DME Agency: [place]             Social Drivers of Health (SDOH) Interventions SDOH Screenings   Food Insecurity: Food Insecurity Present (12/02/2023)  Housing: High Risk (12/02/2023)  Transportation Needs: Unmet Transportation Needs (12/02/2023)  Utilities: Not At Risk (12/02/2023)  Depression (PHQ2-9): Low Risk  (09/30/2023)  Tobacco Use: High Risk (12/01/2023)    Readmission Risk Interventions     No data to display

## 2023-12-07 NOTE — Progress Notes (Signed)
 Speech Language Pathology Treatment: Cognitive-Linguistic  Patient Details Name: [name] MRN: [id number] DOB: 15-Mar-1965 Today's Date: 12/07/2023 Time: 8585-8544 SLP Time Calculation (min) (ACUTE ONLY): 41 min  Assessment / Plan / Recommendation Clinical Impression  Treatment focused on awareness, memory and problem solving with wife present. For awareness pt able to state he needs to work on his thinking. Discussed his memory, attention and stated he notices how he often he falls asleep if not stimulated which SLP educated is typical after TBI. For problem solving he read a menu and answered questions accurately 6/7 but able to identify error and self correct for calculation. Achieved 100% accuracy in determining if pill box filled out correctly given information provided. He completed a written executive functioning/logic/reasoning problem needing mild-mod cues. When given task to recall 3 facts about SLP at end of session and what strategies to use he made stated association and gave accurate example. SLP discussed writing and wrote information to reference if needed. Wife stated pt having difficulty recalling information and purpose from staff at times. SLP created a notebook for pt to write for episodic and prospective memory. Wife states difficulty recalling problems he was having prior to fall. Encouraged to use notebook daily and reference when needed. At end of session he recalled facts about therapist using association cue accurately but did not recall SLP had written information to use if needed. Continue ST in acute care and at outpatient.    HPI HPI: ROMUALDO PROSISE is a 58 y.o. male with medical history significant of hypertension, chronic back pain, GERD who presents to the emergency department via EMS after an unwitnessed fall on the driveway where he sustained a right forehead scalp laceration and hematoma      SLP Plan  Continue with current plan of care           Recommendations   Min 2/week        Inpatient CIR                       Continue with current plan of care     Dustin Olam Bull  12/07/2023, 3:01 PM

## 2023-12-07 NOTE — Progress Notes (Addendum)
 PROGRESS NOTE  [name]  DOB: 10-24-1965  PCP: Maree Isles, MD FMW:[id number]  DOA: 12/01/2023  LOS: 5 days  Hospital Day: 7  Subjective: Patient was seen and examined this morning. He was very sleepy and was dozing in and out of my conversation with him. Wife at bedside states that his headaches have become severe and that patient has been sleeping most of yesterday.   He is alert and oriented when he is awake but seems to sleep very frequently during my conversation. Patient states that his headaches are located in the right region of his head and has slowly become worse.  He had some nausea yesterday but no vomiting noted.  He denies fevers and chills. Patient walked with physical therapy yesterday and wife noted that his blood pressure dropped with ambulation. 11/9--wife is concerned about his sleepiness and worsening headaches.  Patient is alert and oriented x 4. she is concerned about mild cognitive decline.  11/10-patient continues to have headaches, is his biggest concern at this time.  I tried weaning down his Dilaudid  0.5-0.25 patient reported worsening headaches hence I have continued his 0.5 dose.   Brief narrative: [name] is a 58 y.o. male with PMH significant for HTN, chronic back pain, GERD, anxiety, h/o left nephrectomy, recurrent syncope, falls secondary to dysautonomia and orthostatic hypotension. 11/4, patient was brought to the ED at [facility] by EMS after an unwitnessed fall.  Per report, patient had 1 drink at his neighbors house, walked back to his garage and fell.  He was later found by his wife on the floor covered in blood, called EMS. EMS noted right forehead scalp laceration, hematoma. EMS put him on a c-collar and brought to ED.  On arrival, patient was hypothermic at 94 degrees, warming intervention implemented Other vital signs were normal. On initial Triage, he was frequently repeating himself.  Noted to have approximately 5 to 6 mm  laceration to left forehead that required sutures. Initial labs with WC count 16.6, hemoglobin 16.6, sodium 132, renal function normal, lactic acid 4.5, CK troponin normal, blood alcohol level elevated to 85 UDS positive for benzodiazepine Urinalysis normal Chest x-ray, x-ray pelvis were normal CT head showed 1. Acute bilateral convexity subdural hematomas, L>R, with associated interhemispheric/ para falcine hemorrhage and possible trace 1-2 mm rightward shift. 2. Small subarachnoid hemorrhage at the right frontal vertex. 3. Globular extra-axial collections at the right vertex, possibly focal subdural or subarachnoid blood, measuring up to 8 mm. 4. Moderate right forehead scalp laceration and hematoma.  CT cervical spine showed - Questionable nondisplaced C4 spinous process fracture - Trace anterolisthesis at C4 on C5 and C5 and C6 - Moderate degenerative changes at C5-C6 and C6-C7  CT scan maxillofacial showed - acute mildly depressed nasal bone fracture.   - Moderate right forehead laceration and hematoma with moderate-to-marked right periorbital hematoma.   CT chest, abdomen and pelvis did not show any traumatic injury but showed diffuse esophageal wall thickening with mild surrounding inflammation descending small paraesophageal lymph nodes suggestive of esophagitis  Patient was given IV pain control, IV hydration Trauma surgeon on-call Dr. Lyndel was contacted who recommended admitting patient to TRH service at [facility] per   Neurosurgery (Dr. Colon) was consulted and recommended admitting patient to [facility] and to repeat CTA head in 12 hours.  Because of inpatient bed at [facility] not being available, patient was transferred from [facility] to [facility] ED Admitted to TRH 11/5, repeat CT head  showed slight interval decrease in size of right SDH.  Stable appearance of left SDH and [facility] 11/8 CT scan obtained due to drowsiness, showed Stable multifocal bilateral subdural  hematomas (5 mm left, 5 mm parafalcine, and small volume subarachnoid hemorrhage. No midline shift or significant intracranial mass effect.   Assessment and plan: Traumatic head, neck, facial injury -Bilateral subdural hematoma (L > R ) -Subarachnoid hemorrhage -Nondisplaced C4 spinous process fracture -Right forehead scalp laceration and hematoma Secondary to fall at home Has h/o recurrent syncope and fall secondary to dysautonomia and orthostatic hypotension.  Was also under the influence of alcohol at the time.  UDS positive for benzodiazepine.  Patient uses Xanax as needed at home Trauma surgery and neurosurgery were consulted. CT head was stable SDH and SAH.  No surgical intervention per neurosurgery. Regarding cervical spine findings, per Dr. Colon, in absence of cervical tenderness he would not suggest surgical intervention or even c-collar.  Continue PT eval Current pain regimen--weaning down pain medications. --- Scheduled: Tylenol  1 g 3 times daily, Robaxin oral 750mg  every 8 hours --- PRN: IV Dilaudid  0.25 mg every 4 hours, oral oxycodone  5 to 10 mg every 4 hours 11/8--would obtain a CT scan to evaluate SDH and SAH in the setting of excessive sleepiness and worsening headaches. 11/10-SDH and SAH appears stable no acute worsening noted  per CT scan, his headaches persist, will obtain neurology evaluation for assistance in management of his headaches Re-consulted neurosurgery for assistance   H/o recurrent syncope, fall Dysautonomia/orthostatic hypotension Has been struggling with this for several years.  Because of episodic rise in blood pressure, he has been taking amlodipine and lisinopril as well but at times his blood pressure dips down significantly and he passes out.  He follows with PCP and cardiologist.   Currently being monitored in telemetry. 11/5, orthostatic vital signs taken, did not show drop (see below) Echo 10/5 with EF 65 to 70%, no WMA, G1 DD, normal size and  function, RV PTA meds- amlodipine 5 mg nightly, lisinopril 20 mg daily Currently both on hold.  Heart rate and blood pressure have remained in normal range mostly. Home med list also shows sildenafil as needed for ED. I would suggest to not use it given recurrent hypotension and syncope. Patient is orthostatic while ambulating with physical therapy, pression socks and abdominal binders are been used as well Recheck Orthoststic vital signs. Patient remains orthostatic he might need Florinef Vitals:   12/05/23 1149 12/05/23 1537 12/05/23 2030 12/06/23 0035  BP: 128/65 (!) 147/72 135/81 125/73   12/06/23 0413 12/06/23 0905 12/06/23 1103 12/06/23 2015  BP: (!) 121/94 133/87 120/71 (!) 145/95   12/06/23 2025 12/07/23 0012 12/07/23 0417 12/07/23 0737  BP: (!) 151/93 (!) 153/93 (!) 135/111 (!) 136/95     Leukocytosis Elevated ammonia levels Initially leukocytosis suspected to be due to acute distress however WBC count has bumped up to 18--16.4--16.  Unclear etiology of leukocytosis Negative sepsis workup CXR 11/9-->Low lung volumes with mid and lower lung zone mixed interstitial and airspace opacification, new from 12/01/2023, suggesting edema. Pneumonia not excluded. Possible small left pleural effusion. Negative procalcitonin, MRSA, urinalysis, blood cultures. Hold off on antibiotics initiation for now.   monitor labs closely. Added on lactulose for elevated ammonia levels.  46 lactic acidosis-resolved   Possible OSA Nocturnal hypoxia Patient is pending outpatient sleep study.  oxygen level was down to 80s multiple occasions and she started on low-flow oxygen. nocturnal pulse oximetry indicates that patient qualifies for supplemental  oxygen at discharge  Esophagitis CT chest, abdomen and pelvis did not show any traumatic injury but showed diffuse esophageal wall thickening with mild surrounding inflammation descending small paraesophageal lymph nodes suggestive of esophagitis. Patient  endorses longstanding history of reflux and takes Prilosec intermittently. Reports colonoscopy in the past with GI attending but no endoscopy.  Would benefit from GI evaluation as an outpatient  H/o diverticulosis S/p colectomy, colostomy reversal Continue bowel regimen  Alcohol intoxication Alcohol level was 285.  Patient and wife state that he very occasionally drinks.  On the day of presentation, he gave company to his neighbor and just took 1 drink just prior to fall.  No sign of any withdrawal symptoms at this time   Hyponatremia Sodium drifted down to 131--132--130 i Received IV fluids x 1 day some improvements noted.  Repeat labs in the morning show Na of 130. 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 DC prozac, fluid restriction.  Orders have been placed, fluid restriction to 1500 Recent Labs  Lab 12/01/23 2231 12/02/23 0427 12/03/23 0231 12/05/23 0858 12/06/23 0105 12/07/23 0228  NA 132* 136 135 131* 132* 130*   Chronic back pain  anxiety/depression PTA meds- BuSpar 10 mg mg/15 mg mg -am/pm, fluoxetine 60 mg daily, Seroquel 50 mg nightly, Xanax 1 mg daily as needed, Neurontin nightly as needed Cardiology were on hold while somnolent  COPD Bronchodilators.  Left knee pain 11/5, left knee x-ray showed possible foreign body adjacent to the lateral patella. On my exam this morning, patient has a healing scab on the left, but no presence of foreign body  h/o left nephrectomy After trauma at the age of 25   Mobility: PT eval  PT Orders: Active   PT Follow up Rec: Outpatient Pt (Vestibular/Neurologic Rehab)12/04/2023 1600   Goals of care   Code Status: Full Code     DVT prophylaxis:  SCDs Start: 12/02/23 0410   Antimicrobials: None Fluid: Started on IV fluids due to  hyponatremia Consultants: Trauma surgery, neurosurgery Family Communication: Wife at bedside  Status: Inpatient Level of care:  Progressive   Patient is from: Home Needs to continue in-hospital care: Continue monitor for mental status change.  If stable, can probably discharge home tomorrow   Diet:  Diet Order             Diet regular Room service appropriate? Yes; Fluid consistency: Thin  Diet effective now                   Scheduled Meds:  acetaminophen   1,000 mg Oral Q8H   busPIRone  10 mg Oral q morning   And   busPIRone  15 mg Oral QPM   FLUoxetine  60 mg Oral Daily   lactulose  10 g Oral TID   methocarbamol  750 mg Oral Q8H   pantoprazole  40 mg Oral Daily   pneumococcal 20-valent conjugate vaccine  0.5 mL Intramuscular Tomorrow-1000   protein supplement  2 oz Oral QID   QUEtiapine  50 mg Oral QHS   senna-docusate  1 tablet Oral QHS   Tdap  0.5 mL Intramuscular Once    PRN meds: ALPRAZolam, HYDROmorphone  (DILAUDID ) injection, ondansetron  **OR** ondansetron  (ZOFRAN ) IV, oxyCODONE , polyethylene glycol, prochlorperazine   Infusions:      Antimicrobials: Anti-infectives (From  admission, onward)    None       Objective: Vitals:   12/07/23 0417 12/07/23 0737  BP: (!) 135/111 (!) 136/95  Pulse: (!) 52 71  Resp:  15  Temp: 97.6 F (36.4 C) 98.2 F (36.8 C)  SpO2:  93%    Intake/Output Summary (Last 24 hours) at 12/07/2023 1037 Last data filed at 12/06/2023 2200 Gross per 24 hour  Intake 1577.45 ml  Output --  Net 1577.45 ml   Filed Weights   12/01/23 2231  Weight: 107.5 kg   Weight change:  Body mass index is 30.43 kg/m.   Physical Exam: General exam: Pleasant, middle-aged Caucasian male.    Skin: No rashes, lesions or ulcers. HEENT: Sutured wound on the right anterior forehead.  Dried blood on the face Lungs: Clear to auscultation bilaterally,  CVS: S1, S2, no murmur,   GI/Abd: Soft, nontender, nondistended, bowel sound present,  old scar from previous surgery CNS: Somnolent. Drowsy from the influence of pain medications Psychiatry: Mood appropriate Extremities: No pedal edema, no calf tenderness  Data Review: I have personally reviewed the laboratory data and studies available.  F/u labs ordered Unresulted Labs (From admission, onward)     Start     Ordered   12/05/23 1024  Legionella Pneumophila Serogp 1 Ur Ag  Once,   R        12/05/23 1024            Signed, Landon FORBES Baller, MD [facility] 12/07/2023

## 2023-12-08 ENCOUNTER — Inpatient Hospital Stay (HOSPITAL_COMMUNITY)

## 2023-12-08 DIAGNOSIS — S065XAA Traumatic subdural hemorrhage with loss of consciousness status unknown, initial encounter: Secondary | ICD-10-CM | POA: Diagnosis not present

## 2023-12-08 DIAGNOSIS — R0789 Other chest pain: Secondary | ICD-10-CM | POA: Diagnosis not present

## 2023-12-08 DIAGNOSIS — F10920 Alcohol use, unspecified with intoxication, uncomplicated: Secondary | ICD-10-CM | POA: Diagnosis not present

## 2023-12-08 DIAGNOSIS — I951 Orthostatic hypotension: Secondary | ICD-10-CM

## 2023-12-08 LAB — SODIUM, URINE, RANDOM: Sodium, Ur: 60 mmol/L

## 2023-12-08 MED ORDER — FLUOXETINE HCL 20 MG PO CAPS
60.0000 mg | ORAL_CAPSULE | Freq: Every day | ORAL | Status: DC
  Administered 2023-12-08 – 2023-12-12 (×5): 60 mg via ORAL
  Filled 2023-12-08 (×5): qty 3

## 2023-12-08 MED ORDER — AMLODIPINE BESYLATE 2.5 MG PO TABS
2.5000 mg | ORAL_TABLET | Freq: Every day | ORAL | Status: DC
  Administered 2023-12-08 – 2023-12-12 (×5): 2.5 mg via ORAL
  Filled 2023-12-08 (×5): qty 1

## 2023-12-08 MED ORDER — LISINOPRIL 2.5 MG PO TABS: 5.0000 mg | ORAL_TABLET | Freq: Every day | ORAL | Status: DC

## 2023-12-08 MED ORDER — DEXAMETHASONE 2 MG PO TABS
2.0000 mg | ORAL_TABLET | Freq: Two times a day (BID) | ORAL | Status: DC
  Administered 2023-12-08 – 2023-12-12 (×9): 2 mg via ORAL
  Filled 2023-12-08 (×9): qty 1

## 2023-12-08 MED ORDER — HYDRALAZINE HCL 20 MG/ML IJ SOLN
10.0000 mg | Freq: Four times a day (QID) | INTRAMUSCULAR | Status: DC | PRN
  Administered 2023-12-08: 10 mg via INTRAVENOUS
  Filled 2023-12-08 (×2): qty 1

## 2023-12-08 NOTE — Progress Notes (Signed)
 Occupational Therapy Treatment Patient Details Name: [name] MRN: [id number] DOB: May 03, 1965 Today's Date: 12/08/2023   History of present illness Patient is a 58 y/o male admitted 12/01/23 found down on driveway by wife.  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  CT c-spine positive for nondisplaced C4 spinous process fx.  PMH HTN, chronic back pain, GERD, h/o L nephrectomy and recent work up for dysautonomia and orthostatic hypotension.   OT comments  Pt progressing toward established OT goals. Pt expressing concerns regarding difficulty with recalling dates, dual tasking esp relative to cognitive tasks while performing mobility, and higher level attention skills, thus, on pt request, recommending updated dc recs to OP OT. Pt passing short blessed test with score of 4 and mobilizing to and from restroom with grossly CGA-supervision. Pt benefits from cues for slowing down to reduce experience of dizziness. Will continue to follow.       If plan is discharge home, recommend the following:  A little help with bathing/dressing/bathroom;Assistance with cooking/housework;Direct supervision/assist for medications management;Direct supervision/assist for financial management;Assist for transportation;Supervision due to cognitive status   Equipment Recommendations  None recommended by OT    Recommendations for Other Services      Precautions / Restrictions Precautions Precautions: Fall Recall of Precautions/Restrictions: Intact Precaution/Restrictions Comments: monitor BP, orthostatic, watch SpO2 Restrictions Weight Bearing Restrictions Per Provider Order: No       Mobility Bed Mobility Overal bed mobility: Modified Independent                  Transfers Overall transfer level: Needs assistance Equipment used:  None Transfers: Sit to/from Stand Sit to Stand: Contact guard assist           General transfer comment: CGA for safety     Balance Overall balance assessment: Needs assistance Sitting-balance support: No upper extremity supported, Feet supported Sitting balance-Leahy Scale: Good Sitting balance - Comments: sitting EOB   Standing balance support: No upper extremity supported, During functional activity, Bilateral upper extremity supported Standing balance-Leahy Scale: Fair                             ADL either performed or assessed with clinical judgement   ADL Overall ADL's : Needs assistance/impaired     Grooming: Supervision/safety;Standing               Lower Body Dressing: Supervision/safety;Sit to/from stand               Functional mobility during ADLs: Contact guard assist;Rolling walker (2 wheels)      Extremity/Trunk Assessment Upper Extremity Assessment Upper Extremity Assessment: Overall WFL for tasks assessed   Lower Extremity Assessment Lower Extremity Assessment: Defer to PT evaluation        Vision       Perception     Praxis     Communication Communication Communication: No apparent difficulties   Cognition Arousal: Alert Behavior During Therapy: WFL for tasks assessed/performed Cognition: Cognition impaired   Orientation impairments:  (A&O x4 but pt reports difficulty with remembering the date; provided education for reduction of hospital related delirium) Awareness: Online awareness impaired (intellectual awareness appears to be improving) Memory impairment (select all impairments): Short-term memory, Working memory Attention impairment (select first level of impairment): Sustained attention, Selective attention (able to participate in SBT with distraction  of TV volume, but volume low) Executive functioning impairment (select all impairments): Sequencing, Reasoning, Problem solving, Organization OT - Cognition  Comments: passing short blessed testwith score of 2 today               [place] [address] Scales of Cognitive Functioning: Purposeful, Appropriate: Stand-by Assistance on Request [IX] Following commands: Intact (for 1-3 step basic commands)        Cueing   Cueing Techniques: Verbal cues  Exercises      Shoulder Instructions       General Comments SpO2 >90% on RA throughout session    Pertinent Vitals/ Pain       Pain Assessment Pain Assessment: Faces Faces Pain Scale: Hurts even more Pain Location: headache Pain Descriptors / Indicators: Headache Pain Intervention(s): Limited activity within patient's tolerance  Home Living                                          Prior Functioning/Environment              Frequency  Min 2X/week        Progress Toward Goals  OT Goals(current goals can now be found in the care plan section)  Progress towards OT goals: Progressing toward goals  Acute Rehab OT Goals Patient Stated Goal: get better OT Goal Formulation: With patient/family Time For Goal Achievement: 12/17/23 Potential to Achieve Goals: Good ADL Goals Pt Will Perform Lower Body Dressing: with modified independence;sitting/lateral leans;sit to/from stand Pt Will Transfer to Toilet: with modified independence;ambulating;regular height toilet Additional ADL Goal #1: OT to administer pillbox test and will make further discharge recommendations based on results.  Plan      Co-evaluation                 AM-PAC OT 6 Clicks Daily Activity     Outcome Measure   Help from another person eating meals?: None Help from another person taking care of personal grooming?: A Little Help from another person toileting, which includes using toliet, bedpan, or urinal?: A Little Help from another person bathing (including washing, rinsing, drying)?: A Little Help from another person to put on and taking off regular upper body clothing?: A  Little Help from another person to put on and taking off regular lower body clothing?: A Little 6 Click Score: 19    End of Session Equipment Utilized During Treatment: Rolling walker (2 wheels)  OT Visit Diagnosis: Unsteadiness on feet (R26.81);Repeated falls (R29.6);History of falling (Z91.81);Cognitive communication deficit (R41.841);Dizziness and giddiness (R42);Pain Symptoms and signs involving cognitive functions: Nontraumatic SAH (SDH)   Activity Tolerance Patient tolerated treatment well   Patient Left in bed;with call bell/phone within reach;with bed alarm set   Nurse Communication Mobility status        Time: 8667-8587 OT Time Calculation (min): 40 min  Charges: OT General Charges $OT Visit: 1 Visit OT Treatments $Self Care/Home Management : 23-37 mins $Cognitive Funtion inital: Initial 15 mins  Elma JONETTA Lebron FREDERICK, OTR/L [facility] Acute Rehabilitation Office: [phone]   Elma JONETTA Lebron 12/08/2023, 4:58 PM

## 2023-12-08 NOTE — Progress Notes (Signed)
 Patient ID: [name], male   DOB: 10/02/1965, 58 y.o.   MRN: [id number] Patient without changes Offered reassurance to patient and family.

## 2023-12-08 NOTE — Plan of Care (Signed)

## 2023-12-08 NOTE — Progress Notes (Signed)
 Physical Therapy Treatment Patient Details Name: [name] MRN: [id number] DOB: December 23, 1965 Today's Date: 12/08/2023   History of Present Illness Patient is a 58 y/o male admitted 12/01/23 found down on driveway by wife.  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  CT c-spine positive for nondisplaced C4 spinous process fx.  PMH HTN, chronic back pain, GERD, h/o L nephrectomy and recent work up for dysautonomia and orthostatic hypotension.    PT Comments  Pt received in supine and agreeable to session. Pt able to perform gait trial with SPC, but demonstrates decreased fluidity of gait due to increased difficulty with sequencing. Pt reports increased stability and comfort using RW at this time. BP noted to be 128/96 sitting after gait trial and 129/73 standing. Pt reports mild orthostatic symptoms during session. Pt educated on self-monitoring orthostatic symptoms and importance of addressing prior to syncopal event as pt reports attempting to push past symptoms leading to falls in the past. Pt encouraged to begin alerting staff of symptoms during mobility and reporting need for seated rest breaks with pt verbalizing understanding. Pt continues to benefit from PT services to progress toward functional mobility goals.      If plan is discharge home, recommend the following: Help with stairs or ramp for entrance;Assist for transportation;Assistance with cooking/housework;Direct supervision/assist for medications management;Direct supervision/assist for financial management;Supervision due to cognitive status   Can travel by private vehicle        Equipment Recommendations  Rolling walker (2 wheels)    Recommendations for Other Services       Precautions / Restrictions Precautions Precautions: Fall Recall of Precautions/Restrictions:  Intact Precaution/Restrictions Comments: monitor BP, orthostatic, watch SpO2 Restrictions Weight Bearing Restrictions Per Provider Order: Gay     Mobility  Bed Mobility Overal bed mobility: Modified Independent             General bed mobility comments: HOB elevated, Gay assistance needed    Transfers Overall transfer level: Needs assistance Equipment used: None Transfers: Sit to/from Stand Sit to Stand: Contact guard assist           General transfer comment: CGA for safety    Ambulation/Gait Ambulation/Gait assistance: Contact guard assist Gait Distance (Feet): 75 Feet Assistive device: Straight cane Gait Pattern/deviations: Step-to pattern, Step-through pattern, Decreased stride length Gait velocity: reduced     General Gait Details: Pt demonstrates mostly step-to gait pattern with cues for sequencing with SPC. Pt demonstrates decreased fluidity and tends to stop before advancing [facility] each time. Some instability, but Gay LOB with CGA for safety   Stairs             Wheelchair Mobility     Tilt Bed    Modified Rankin (Stroke Patients Only)       Balance Overall balance assessment: Needs assistance Sitting-balance support: Gay upper extremity supported, Feet supported Sitting balance-Leahy Scale: Good Sitting balance - Comments: sitting EOB   Standing balance support: Gay upper extremity supported, During functional activity, Bilateral upper extremity supported Standing balance-Leahy Scale: Fair Standing balance comment: with SPC support                            Communication Communication Communication: Gay apparent difficulties  Cognition Arousal: Alert Behavior During Therapy: WFL for tasks assessed/performed   PT - Cognitive impairments: Memory, Attention, Rancho  level                   Rancho Levels of Cognitive Functioning Rancho Los Amigos Scales of Cognitive Functioning: Purposeful, Appropriate: Stand-by Assistance  on Request Rancho Mirant Scales of Cognitive Functioning: Purposeful, Appropriate: Stand-by Assistance on Request [IX]   Following commands: Intact      Cueing Cueing Techniques: Verbal cues  Exercises      General Comments General comments (skin integrity, edema, etc.): SpO2 >90% on RA throughout session      Pertinent Vitals/Pain Pain Assessment Pain Assessment: Faces Faces Pain Scale: Hurts even more Pain Location: headache Pain Descriptors / Indicators: Headache Pain Intervention(s): Limited activity within patient's tolerance, Monitored during session, Repositioned     PT Goals (current goals can now be found in the care plan section) Acute Rehab PT Goals Patient Stated Goal: return home PT Goal Formulation: With patient/family Time For Goal Achievement: 12/09/23 Progress towards PT goals: Progressing toward goals    Frequency    Min 3X/week       AM-PAC PT 6 Clicks Mobility   Outcome Measure  Help needed turning from your back to your side while in a flat bed without using bedrails?: None Help needed moving from lying on your back to sitting on the side of a flat bed without using bedrails?: None Help needed moving to and from a bed to a chair (including a wheelchair)?: A Little Help needed standing up from a chair using your arms (e.g., wheelchair or bedside chair)?: A Little Help needed to walk in hospital room?: A Little Help needed climbing 3-5 steps with a railing? : A Little 6 Click Score: 20    End of Session Equipment Utilized During Treatment: Gait belt Activity Tolerance: Patient tolerated treatment well Patient left: in bed;with call bell/phone within reach;with family/visitor present;with bed alarm set Nurse Communication: Mobility status PT Visit Diagnosis: Other abnormalities of gait and mobility (R26.89);History of falling (Z91.81);Other symptoms and signs involving the nervous system (R29.898);Unsteadiness on feet (R26.81)      Time: 1435-1510 PT Time Calculation (min) (ACUTE ONLY): 35 min  Charges:    $Gait Training: 8-22 mins $Therapeutic Activity: 8-22 mins PT General Charges $$ ACUTE PT VISIT: 1 Visit                     Darryle George, PTA Acute Rehabilitation Services Secure Chat Preferred  [phone]    Darryle George 12/08/2023, 3:41 PM

## 2023-12-08 NOTE — Progress Notes (Addendum)
 [facility]  PROGRESS NOTE  [name] FMW:[id number] DOB: 04-22-1965 DOA: 12/01/2023 PCP: Maree Isles, MD   Brief HPI:    58 y.o. male with PMH significant for HTN, chronic back pain, GERD, anxiety, h/o left nephrectomy, recurrent syncope, falls secondary to dysautonomia and orthostatic hypotension. 11/4, patient was brought to the ED at [facility] by EMS after an unwitnessed fall.   Per report, patient had 1 drink at his neighbors house, walked back to his garage and fell.  He was later found by his wife on the floor covered in blood, called EMS. EMS noted right forehead scalp laceration, hematoma. EMS put him on a c-collar and brought to ED.   On arrival, patient was hypothermic at 94 degrees, warming intervention implemented Other vital signs were normal. On initial Triage, he was frequently repeating himself.  Noted to have approximately 5 to 6 mm laceration to left forehead that required sutures. CT head showed 1. Acute bilateral convexity subdural hematomas, L>R, with associated interhemispheric/ para falcine hemorrhage and possible trace 1-2 mm rightward shift. 2. Small subarachnoid hemorrhage at the right frontal vertex. 3. Globular extra-axial collections at the right vertex, possibly focal subdural or subarachnoid blood, measuring up to 8 mm. 4. Moderate right forehead scalp laceration and hematoma.   CT cervical spine showed - Questionable nondisplaced C4 spinous process fracture - Trace anterolisthesis at C4 on C5 and C5 and C6 - Moderate degenerative changes at C5-C6 and C6-C7   CT scan maxillofacial showed - acute mildly depressed nasal bone fracture.   - Moderate right forehead laceration and hematoma with moderate-to-marked right periorbital hematoma.    CT chest, abdomen and pelvis did not show any traumatic injury but showed diffuse esophageal wall thickening with mild surrounding inflammation descending small paraesophageal lymph nodes suggestive of  esophagitis   Patient was given IV pain control, IV hydration Trauma surgeon on-call Dr. Lyndel was contacted who recommended admitting patient to TRH service at [facility] per   Neurosurgery (Dr. Colon) was consulted and recommended admitting patient to [facility] and to repeat CTA head in 12 hours.   Because of inpatient bed at [facility] not being available, patient was transferred from [facility] ED to [facility] ED Admitted to [facility] 11/5, repeat CT head showed slight interval decrease in size of right SDH.  Stable appearance of left SDH and [facility] 11/8 CT scan obtained due to drowsiness, showed Stable multifocal bilateral subdural hematomas (5 mm left, 5 mm parafalcine, and small volume subarachnoid hemorrhage. No midline shift or significant intracranial mass effect.     Assessment/Plan:   Traumatic head, neck, facial injury -Bilateral subdural hematoma (L > R ) -Subarachnoid hemorrhage -Nondisplaced C4 spinous process fracture -Right forehead scalp laceration and hematoma - Secondary to fall as above; he has history of recurrent syncope due to dysautonomia and orthostatic hypotension - UDS positive for benzodiazepine; patient uses Xanax as needed at home - Trauma surgery and neurosurgery were consulted - CT head was stable SDH and SAH, no surgical intervention per neurosurgery - Dr. Lenox recommended Decadron 2 mg p.o. twice daily for 10 days and then taper off; continue conservative treatment as per neurosurgery -Regarding cervical spine findings, per Dr. Colon, in absence of cervical tenderness he would not suggest surgical intervention or even c-collar.  Continue PT eval Current pain regimen--weaning down pain medications. --- Scheduled: Tylenol  1 g 3 times daily, Robaxin oral 750mg  every 8 hours --- PRN: IV Dilaudid  0.25 mg every 4 hours, oral oxycodone   5 to 10 mg every 4 hours - Taper off pain medications over next few days  History of recurrent syncope, fall - Dysautonomia,  orthostatic hypotension - Chronic problem - Had significant orthostatic hypotension this morning - Improved after putting TED hose and also started on Decadron as above  Hypertension - Blood pressure has been elevated - Will start amlodipine 2.5 mg daily  Nocturnal hypoxemia - He will need outpatient sleep study -He is on significant opioid medications which can cause hypoxemia - Will get nocturnal pulse oximetry  H/o diverticulosis S/p colectomy, colostomy reversal Continue bowel regimen  Right chest wall pain -Rib series x-ray was negative for rib fracture   Alcohol intoxication Alcohol level was 285.  Patient and wife state that he very occasionally drinks.  On the day of presentation, he gave company to his neighbor and just took 1 drink just prior to fall.  No sign of any withdrawal symptoms at this time   Hyponatremia Sodium drifted down to 131--132--130 i Received IV fluids x 1 day some improvements noted.  Repeat labs in the morning show Na of 130. 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 DC prozac, fluid restriction.  Sodium is stable at 130 -Prozac has been restarted as per patient request  Chronic back pain  anxiety/depression PTA meds- BuSpar 10 mg mg/15 mg mg -am/pm, fluoxetine 60 mg daily, Seroquel 50 mg nightly, Xanax 1 mg daily as needed, Neurontin nightly as needed -Prozac restarted as per patient request   COPD Bronchodilators.   Left knee pain 11/5, left knee x-ray showed possible foreign body adjacent to the lateral patella. On my exam this morning, patient has a healing scab on the left, but no presence of foreign body   h/o left nephrectomy After trauma at the age of 33   DVT prophylaxis: SCDs  Medications     (feeding supplement) PROSource Plus  30  mL Oral BID BM   acetaminophen   1,000 mg Oral Q8H   amLODipine  2.5 mg Oral Daily   busPIRone  10 mg Oral q morning   And   busPIRone  15 mg Oral QPM   dexamethasone  2 mg Oral Q12H   FLUoxetine  60 mg Oral Daily   lactulose  10 g Oral TID   methocarbamol  750 mg Oral Q8H   pantoprazole  40 mg Oral Daily   pneumococcal 20-valent conjugate vaccine  0.5 mL Intramuscular Tomorrow-1000   Ensure Max Protein  11 oz Oral BID   QUEtiapine  50 mg Oral QHS   senna-docusate  1 tablet Oral QHS   Tdap  0.5 mL Intramuscular Once     Data Reviewed:   CBG:  No results for input(s): GLUCAP in the last 168 hours.  SpO2: 95 % O2 Flow Rate (L/min): 2 L/min    Vitals:   12/08/23 0700 12/08/23 0800 12/08/23 1111 12/08/23 1500  BP: (!) 178/108 (!) 153/100 134/74 (!) 132/107  Pulse: 75 67 62 76  Resp:   18   Temp:  98.7 F (37.1 C) 97.9 F (36.6 C) 98.4 F (36.9 C)  TempSrc:   Oral Oral  SpO2:  94% 95% 95%  Weight:      Height:          Data Reviewed:  Basic Metabolic  Panel: Recent Labs  Lab 12/02/23 0427 12/03/23 0231 12/05/23 0858 12/06/23 0105 12/07/23 0228  NA 136 135 131* 132* 130*  K 4.3 4.1 3.7 4.0 3.8  CL 104 101 97* 96* 98  CO2 20* 25 22 26 25   GLUCOSE 94 116* 130* 115* 117*  BUN 8 7 11 10 8   CREATININE 0.97 1.08 0.92 0.95 0.78  CALCIUM 8.0* 8.2* 8.6* 8.5* 8.6*  MG 1.9  --   --   --   --   PHOS 3.9  --   --   --   --     CBC: Recent Labs  Lab 12/02/23 0427 12/03/23 0231 12/05/23 0858 12/06/23 0105 12/07/23 0228  WBC 17.5* 14.5* 18.1* 16.7* 16.0*  NEUTROABS  --  9.1*  --  10.9* 11.1*  HGB 14.6 13.8 14.3 14.1 13.8  HCT 42.4 40.4 41.3 40.9 40.1  MCV 101.9* 101.5* 100.7* 100.7* 100.0  PLT 263 249 237 265 282    LFT Recent Labs  Lab 12/01/23 2231 12/02/23 0427 12/05/23 0858 12/06/23 0105 12/07/23 0228  AST 53* 45* 21 20 23   ALT 51* 40 25 24 25   ALKPHOS 112 71 72 71 65  BILITOT 0.4 0.8 1.2 0.9 0.9  PROT 7.4 6.0* 6.1* 6.2* 6.0*  ALBUMIN 4.2  3.1* 2.9* 2.8* 2.8*     Antibiotics: Anti-infectives (From admission, onward)    None        CONSULTS neurosurgery  Code Status: Full code  Family Communication: Discussed with patient's wife at bedside     Subjective   Complained of pain in the right lower chest wall especially on deep breathing   Objective    Physical Examination:   General-appears in no acute distress Heart-S1-S2, regular, no murmur auscultated Lungs-clear to auscultation bilaterally, no wheezing or crackles auscultated Abdomen-soft, nontender, no organomegaly Extremities-no edema in the lower extremities Neuro-alert, oriented x3, no focal deficit noted   Status is: Inpatient:             Sabas GORMAN Brod   [facility] If 7PM-7AM, please contact night-coverage at [email or web address], Office  [phone]   12/08/2023, 5:59 PM  LOS: 6 days

## 2023-12-09 DIAGNOSIS — S065XAA Traumatic subdural hemorrhage with loss of consciousness status unknown, initial encounter: Secondary | ICD-10-CM | POA: Diagnosis not present

## 2023-12-09 LAB — COMPREHENSIVE METABOLIC PANEL WITH GFR
ALT: 25 U/L (ref 0–44)
AST: 23 U/L (ref 15–41)
Albumin: 3 g/dL — ABNORMAL LOW (ref 3.5–5.0)
Alkaline Phosphatase: 68 U/L (ref 38–126)
Anion gap: 10 (ref 5–15)
BUN: 13 mg/dL (ref 6–20)
CO2: 24 mmol/L (ref 22–32)
Calcium: 9.2 mg/dL (ref 8.9–10.3)
Chloride: 99 mmol/L (ref 98–111)
Creatinine, Ser: 0.93 mg/dL (ref 0.61–1.24)
GFR, Estimated: >60 mL/min (ref >60)
Glucose, Bld: 134 mg/dL — ABNORMAL HIGH (ref 70–99)
Potassium: 3.9 mmol/L (ref 3.5–5.1)
Sodium: 133 mmol/L — ABNORMAL LOW (ref 135–145)
Total Bilirubin: 0.7 mg/dL (ref 0.0–1.2)
Total Protein: 6.6 g/dL (ref 6.5–8.1)

## 2023-12-09 LAB — CBC
HCT: 41.5 % (ref 39.0–52.0)
Hemoglobin: 14.4 g/dL (ref 13.0–17.0)
MCH: 34.1 pg — ABNORMAL HIGH (ref 26.0–34.0)
MCHC: 34.7 g/dL (ref 30.0–36.0)
MCV: 98.3 fL (ref 80.0–100.0)
Platelets: 329 K/uL (ref 150–400)
RBC: 4.22 MIL/uL (ref 4.22–5.81)
RDW: 11.8 % (ref 11.5–15.5)
WBC: 17.2 K/uL — ABNORMAL HIGH (ref 4.0–10.5)
nRBC: 0 % (ref 0.0–0.2)

## 2023-12-09 MED ORDER — BUTALBITAL-APAP-CAFFEINE 50-325-40 MG PO TABS
1.0000 | ORAL_TABLET | Freq: Four times a day (QID) | ORAL | Status: DC | PRN
  Administered 2023-12-09 – 2023-12-11 (×8): 1 via ORAL
  Filled 2023-12-09 (×8): qty 1

## 2023-12-09 NOTE — Progress Notes (Signed)
 PROGRESS NOTE  [name] FMW:[id number] DOB: [birth year] DOA: 12/01/2023 PCP: Maree Isles, MD   LOS: 7 days   Brief narrative:  58 y.o. male with past medical history significant for HTN, chronic back pain, GERD, anxiety, h/o left nephrectomy, recurrent syncope, falls secondary to dysautonomia and orthostatic hypotension presented to the hospital initially [facility] by EMS after unwitnessed fall covered in blood after having a drink at the neighbors house.  EMS noted right forehead scalp laceration, hematoma, he was put on a c-collar and was brought into the hospital.  In the ED patient was hypothermic at 94 degrees, was noted to have approximately 5 to 6 mm laceration to left forehead that required sutures.  CT head scan showed acute bilateral convexity subdural hematoma left more than the right with associated interhemispheric/ para falcine hemorrhage and possible trace 1-2 mm rightward shift. Small subarachnoid hemorrhage at the right frontal vertex. Globular extra-axial collections at the right vertex, possibly focal subdural or subarachnoid blood, measuring up to 8 mm, Moderate right forehead scalp laceration and hematoma.  CT cervical spine showed questionable nondisplaced C4 spinous process fracture and CT scan of the maxillofacial area showed mildly depressed nasal bone fracture.  CT scan of the chest abdomen and pelvis without any findings of trauma.  Initially patient was seen by trauma surgery and neurosurgery was also consulted.       Assessment/Plan: Principal Problem:   Subdural hematoma (HCC)   Traumatic head, neck, facial injury -Bilateral subdural hematoma (L > R ) -Subarachnoid hemorrhage -Nondisplaced C4 spinous process fracture -Right forehead scalp laceration and hematoma Secondary to falls.  UDS positive for benzos.  Trauma surgery and neurosurgery on board.  CT head stable for SDH and subarachnoid hemorrhage.  Dr. Lenox recommended Decadron 2 mg p.o. twice  daily for 10 days and then taper off; continue conservative treatment as per neurosurgery. Regarding cervical spine findings, per Dr. Colon, in absence of cervical tenderness he would not suggest surgical intervention or even c-collar.  Continue pain management.  On Tylenol  Robaxin Dilaudid , plan to minimize Dilaudid .  Add Fioricet.  Headache could be secondary to caffeine withdrawal. Plan is to continue to wean pain medications as possible.   History of recurrent syncope, fall Secondary to dysautonomia and orthostatic hypotension which is chronic.  Continue TED hose.  Has been started on Decadron   Hypertension Blood pressure was elevated so patient was started on amlodipine 2.5 mg daily   Nocturnal hypoxemia - He will need outpatient sleep study   H/o diverticulosis S/p colectomy, colostomy reversal Continue bowel regimen   Right chest wall pain Rib x-ray was negative for rib fracture.   Alcohol intoxication Initial alcohol level was 285.  Occasionally drinks.   Hyponatremia Mild at this time.  Latest sodium of 133.  Received IV fluids.  Possibility of SIADH.  Prozac has been initiated.  Will continue to monitor.  Chronic back pain  anxiety/depression Patient was on BuSpar fluoxetine Seroquel and Xanax and Neurontin as outpatient.  Prozac, Seroquel, Xanax, buspirone on board  COPD Continue bronchodilators   Left knee pain X-ray of the left knee showed some possible foreign body.  Clinically has a healing scab  h/o left nephrectomy Traumatic at the age of 41  Recurrent fall syncope.  Seen by physical therapy and recommended outpatient PT on discharge.  DVT prophylaxis: Place and maintain sequential compression device Start: 12/08/23 0928 SCDs Start: 12/02/23 0410   Disposition: Outpatient PT on discharge  Likely in 1 to  2 days  Status is: Inpatient Remains inpatient appropriate because: Pending clinical improvement.    Code Status:     Code Status: Full  Code  Family Communication: Spoke with patient's spouse at bedside  Consultants: Neurosurgery  Procedures: None  Anti-infectives:  None  Anti-infectives (From admission, onward)    None        Subjective: Today, patient was seen and examined at bedside.  Still complains of a 7 x 10 pain in the head, patient's spouse at bedside concerned that he has not quite better,concern for dizziness and blood pressure issues.  Objective: Vitals:   12/09/23 0700 12/09/23 1126  BP: (!) 161/91 126/81  Pulse: (!) 59 60  Resp: 16 18  Temp: 98.1 F (36.7 C) 98 F (36.7 C)  SpO2: 94%    No intake or output data in the 24 hours ending 12/09/23 1202 Filed Weights   12/01/23 2231  Weight: 107.5 kg   Body mass index is 30.43 kg/m.   Physical Exam: GENERAL: Patient is alert awake and oriented. Not in obvious distress. HENT: No scleral pallor or icterus. Pupils equally reactive to light. Oral mucosa is moist NECK: is supple, no gross swelling noted. CHEST: Clear to auscultation. No crackles or wheezes.  Diminished breath sounds bilaterally. CVS: S1 and S2 heard, no murmur. Regular rate and rhythm.  ABDOMEN: Soft, non-tender, bowel sounds are present. EXTREMITIES: No edema. CNS: Cranial nerves are intact. No focal motor deficits. SKIN: warm and dry without rashes.  Data Review: I have personally reviewed the following laboratory data and studies,  CBC: Recent Labs  Lab 12/03/23 0231 12/05/23 0858 12/06/23 0105 12/07/23 0228 12/09/23 0123  WBC 14.5* 18.1* 16.7* 16.0* 17.2*  NEUTROABS 9.1*  --  10.9* 11.1*  --   HGB 13.8 14.3 14.1 13.8 14.4  HCT 40.4 41.3 40.9 40.1 41.5  MCV 101.5* 100.7* 100.7* 100.0 98.3  PLT 249 237 265 282 329   Basic Metabolic Panel: Recent Labs  Lab 12/03/23 0231 12/05/23 0858 12/06/23 0105 12/07/23 0228 12/09/23 0123  NA 135 131* 132* 130* 133*  K 4.1 3.7 4.0 3.8 3.9  CL 101 97* 96* 98 99  CO2 25 22 26 25 24   GLUCOSE 116* 130* 115* 117*  134*  BUN 7 11 10 8 13   CREATININE 1.08 0.92 0.95 0.78 0.93  CALCIUM 8.2* 8.6* 8.5* 8.6* 9.2   Liver Function Tests: Recent Labs  Lab 12/05/23 0858 12/06/23 0105 12/07/23 0228 12/09/23 0123  AST 21 20 23 23   ALT 25 24 25 25   ALKPHOS 72 71 65 68  BILITOT 1.2 0.9 0.9 0.7  PROT 6.1* 6.2* 6.0* 6.6  ALBUMIN 2.9* 2.8* 2.8* 3.0*   No results for input(s): LIPASE, AMYLASE in the last 168 hours. Recent Labs  Lab 12/05/23 1319  AMMONIA 46*   Cardiac Enzymes: No results for input(s): CKTOTAL, CKMB, CKMBINDEX, TROPONINI in the last 168 hours. BNP (last 3 results) Recent Labs    12/06/23 0105  BNP 50.0    ProBNP (last 3 results) No results for input(s): PROBNP in the last 8760 hours.  CBG: No results for input(s): GLUCAP in the last 168 hours. Recent Results (from the past 240 hours)  Culture, blood (Routine X 2) w Reflex to ID Panel     Status: None (Preliminary result)   Collection Time: 12/05/23 11:03 AM   Specimen: BLOOD RIGHT ARM  Result Value Ref Range Status   Specimen Description BLOOD RIGHT ARM  Final   Special Requests  Final    BOTTLES DRAWN AEROBIC AND ANAEROBIC Blood Culture adequate volume   Culture   Final    NO GROWTH 4 DAYS Performed at [facility], [address] [place], [place] [address]    Report Status PENDING  Incomplete  Culture, blood (Routine X 2) w Reflex to ID Panel     Status: None (Preliminary result)   Collection Time: 12/05/23 11:03 AM   Specimen: BLOOD RIGHT HAND  Result Value Ref Range Status   Specimen Description BLOOD RIGHT HAND  Final   Special Requests   Final    BOTTLES DRAWN AEROBIC AND ANAEROBIC Blood Culture results may not be optimal due to an inadequate volume of blood received in culture bottles   Culture   Final    NO GROWTH 4 DAYS Performed at [facility], [address], [place], [place] [address]    Report Status PENDING  Incomplete  MRSA Next Gen by PCR, Nasal     Status: None    Collection Time: 12/05/23 12:37 PM  Result Value Ref Range Status   MRSA by PCR Next Gen NOT DETECTED NOT DETECTED Final    Comment: (NOTE) The GeneXpert MRSA Assay (FDA approved for NASAL specimens only), is one component of a comprehensive MRSA colonization surveillance program. It is not intended to diagnose MRSA infection nor to guide or monitor treatment for MRSA infections. Test performance is not FDA approved in patients less than 92 years old. Performed at [facility], [address], [place], [place] [address]      Studies: DG Ribs Unilateral Right Result Date: 12/08/2023 CLINICAL DATA:  Right chest pain.  No reported injury. EXAM: RIGHT RIBS - 2 VIEW COMPARISON:  Portable chest dated 12/05/2023 FINDINGS: Normal-appearing right ribs without fracture or intrinsic bony abnormality. Clear right lung. Left upper quadrant abdominal surgical clips. IMPRESSION: No right rib abnormality seen. Electronically Signed   By: Elspeth Bathe M.D.   On: 12/08/2023 15:10      Vernal Alstrom, MD  [facility] 12/09/2023  If 7PM-7AM, please contact night-coverage

## 2023-12-09 NOTE — Plan of Care (Signed)
   Problem: Education: Goal: Knowledge of General Education information will improve Description: Including pain rating scale, medication(s)/side effects and non-pharmacologic comfort measures Outcome: Progressing   Problem: Health Behavior/Discharge Planning: Goal: Ability to manage health-related needs will improve Outcome: Progressing   Problem: Clinical Measurements: Goal: Will remain free from infection Outcome: Progressing Goal: Diagnostic test results will improve Outcome: Progressing Goal: Respiratory complications will improve Outcome: Progressing Goal: Cardiovascular complication will be avoided Outcome: Progressing   Problem: Activity: Goal: Risk for activity intolerance will decrease Outcome: Progressing   Problem: Nutrition: Goal: Adequate nutrition will be maintained Outcome: Progressing

## 2023-12-09 NOTE — Plan of Care (Signed)
  Problem: Education: Goal: Knowledge of General Education information will improve Description: Including pain rating scale, medication(s)/side effects and non-pharmacologic comfort measures Outcome: Progressing   Problem: Clinical Measurements: Goal: Ability to maintain clinical measurements within normal limits will improve Outcome: Progressing   Problem: Clinical Measurements: Goal: Diagnostic test results will improve Outcome: Progressing   

## 2023-12-09 NOTE — Progress Notes (Signed)
 Physical Therapy Treatment Patient Details Name: [name] MRN: [id number] DOB: 22-May-1965 Today's Date: 12/09/2023   History of Present Illness Patient is a 58 y/o male admitted 12/01/23 found down on driveway by wife.  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  CT c-spine positive for nondisplaced C4 spinous process fx.  PMH HTN, chronic back pain, GERD, h/o L nephrectomy and recent work up for dysautonomia and orthostatic hypotension.    PT Comments  Pt received in supine and agreeable to session. Pt report lightheadedness during initial stand and intermittently during gait that improves quickly and no seated rest required. Pt able to tolerate increased gait distance with RW support, but requires increased cues for reduced use of BUE support and to relax shoulders to decrease tension. Pt able to participate in balance exercise with intermittent min A for stability with increased difficulty with RLE stance. Continued to reinforce education on orthostatic symptom management and self-awareness. Pt continues to benefit from PT services to progress toward functional mobility goals.    If plan is discharge home, recommend the following: Help with stairs or ramp for entrance;Assist for transportation;Assistance with cooking/housework;Direct supervision/assist for medications management;Direct supervision/assist for financial management;Supervision due to cognitive status   Can travel by private vehicle        Equipment Recommendations  Rolling walker (2 wheels)    Recommendations for Other Services       Precautions / Restrictions Precautions Precautions: Fall Recall of Precautions/Restrictions: Intact Precaution/Restrictions Comments: monitor BP, orthostatic, watch SpO2     Mobility  Bed Mobility Overal bed mobility: Modified  Independent             General bed mobility comments: HOB elevated, no assistance needed    Transfers Overall transfer level: Needs assistance Equipment used: Rolling walker (2 wheels) Transfers: Sit to/from Stand Sit to Stand: Supervision           General transfer comment: supervision for safety and cues to pause to ensure lightheadedness improves before beginning ambulation    Ambulation/Gait Ambulation/Gait assistance: Contact guard assist Gait Distance (Feet): 350 Feet Assistive device: Rolling walker (2 wheels) Gait Pattern/deviations: Step-through pattern, Decreased stride length Gait velocity: reduced     General Gait Details: Pt demonstrates slow, guarded gait requiring cues to relax shoulders and decrease BUE support as able to decrease neck tension   Stairs             Wheelchair Mobility     Tilt Bed    Modified Rankin (Stroke Patients Only)       Balance Overall balance assessment: Needs assistance Sitting-balance support: No upper extremity supported, Feet supported Sitting balance-Leahy Scale: Good Sitting balance - Comments: sitting EOB   Standing balance support: No upper extremity supported, During functional activity, Bilateral upper extremity supported Standing balance-Leahy Scale: Fair Standing balance comment: with RW support               High Level Balance Comments: BLE cone taps 2x10            Communication Communication Communication: No apparent difficulties  Cognition Arousal: Alert Behavior During Therapy: WFL for tasks assessed/performed   PT - Cognitive impairments: Memory, Attention, Rancho level                   Rancho Levels of Cognitive Functioning Rancho Los Amigos Scales of Cognitive Functioning:  Purposeful, Appropriate: Stand-by Assistance on Request Rancho Mirant Scales of Cognitive Functioning: Purposeful, Appropriate: Stand-by Assistance on Request [IX]   Following commands:  Intact      Cueing Cueing Techniques: Verbal cues  Exercises      General Comments        Pertinent Vitals/Pain Pain Assessment Pain Assessment: 0-10 Pain Score: 7  Pain Location: headache Pain Descriptors / Indicators: Headache Pain Intervention(s): Monitored during session      PT Goals (current goals can now be found in the care plan section) Acute Rehab PT Goals Patient Stated Goal: return home PT Goal Formulation: With patient/family Time For Goal Achievement: 12/09/23 Progress towards PT goals: Progressing toward goals    Frequency    Min 3X/week       AM-PAC PT 6 Clicks Mobility   Outcome Measure  Help needed turning from your back to your side while in a flat bed without using bedrails?: None Help needed moving from lying on your back to sitting on the side of a flat bed without using bedrails?: None Help needed moving to and from a bed to a chair (including a wheelchair)?: A Little Help needed standing up from a chair using your arms (e.g., wheelchair or bedside chair)?: A Little Help needed to walk in hospital room?: A Little Help needed climbing 3-5 steps with a railing? : A Little 6 Click Score: 20    End of Session Equipment Utilized During Treatment: Gait belt Activity Tolerance: Patient tolerated treatment well Patient left: in bed;with call bell/phone within reach;with family/visitor present;with bed alarm set Nurse Communication: Mobility status PT Visit Diagnosis: Other abnormalities of gait and mobility (R26.89);History of falling (Z91.81);Other symptoms and signs involving the nervous system (R29.898);Unsteadiness on feet (R26.81)     Time: 1459-1540 PT Time Calculation (min) (ACUTE ONLY): 41 min  Charges:    $Gait Training: 23-37 mins $Neuromuscular Re-education: 8-22 mins PT General Charges $$ ACUTE PT VISIT: 1 Visit                    Darryle George, PTA Acute Rehabilitation Services Secure Chat Preferred  [phone]  [phone]    Darryle George 12/09/2023, 4:00 PM

## 2023-12-09 NOTE — Hospital Course (Signed)
 58 y.o. male with past medical history significant for HTN, chronic back pain, GERD, anxiety, h/o left nephrectomy, recurrent syncope, falls secondary to dysautonomia and orthostatic hypotension presented to the hospital initially at [facility] by EMS after unwitnessed fall covered in blood after having a drink at the neighbors house.  EMS noted right forehead scalp laceration, hematoma, he was put on a c-collar and was brought into the hospital.  In the ED patient was hypothermic at 94 degrees, was noted to have approximately 5 to 6 mm laceration to left forehead that required sutures.  CT head scan showed acute bilateral convexity subdural hematoma left more than the right with associated interhemispheric/ para falcine hemorrhage and possible trace 1-2 mm rightward shift. Small subarachnoid hemorrhage at the right frontal vertex. Globular extra-axial collections at the right vertex, possibly focal subdural or subarachnoid blood, measuring up to 8 mm, Moderate right forehead scalp laceration and hematoma.  CT cervical spine showed questionable nondisplaced C4 spinous process fracture and CT scan of the maxillofacial area showed mildly depressed nasal bone fracture.  CT scan of the chest abdomen and pelvis without any findings of trauma.  Initially patient was seen by trauma surgery and neurosurgery was also consulted.    Traumatic head, neck, facial injury -Bilateral subdural hematoma (L > R ) -Subarachnoid hemorrhage -Nondisplaced C4 spinous process fracture -Right forehead scalp laceration and hematoma Secondary to falls.  UDS positive for benzos.  Trauma surgery and neurosurgery on board.  CT head stable for SDH and subarachnoid hemorrhage.  Dr. Lenox recommended Decadron 2 mg p.o. twice daily for 10 days and then taper off; continue conservative treatment as per neurosurgery. Regarding cervical spine findings, per Dr. Colon, in absence of cervical tenderness he would not suggest surgical intervention  or even c-collar.  Continue pain management.  On Tylenol  Robaxin Dilaudid  Plan is to continue to wean pain medications as possible.   History of recurrent syncope, fall Secondary to dysautonomia and orthostatic hypotension which is chronic.  Continue TED hose.  Has been started on Decadron   Hypertension Blood pressure was elevated so patient was started on amlodipine 2.5 mg daily   Nocturnal hypoxemia - He will need outpatient sleep study   H/o diverticulosis S/p colectomy, colostomy reversal Continue bowel regimen   Right chest wall pain Rib x-ray was negative for rib fracture.   Alcohol intoxication Initial alcohol level was 285.  Occasionally drinks.   Hyponatremia Mild at this time.  Latest sodium of 133.  Received IV fluids.  Possibility of SIADH.  Prozac has been initiated.  Will continue to monitor.  Chronic back pain  anxiety/depression Patient was on BuSpar fluoxetine Seroquel and Xanax and Neurontin as outpatient.  Prozac, Seroquel, Xanax, buspirone on board  COPD Continue bronchodilators   Left knee pain X-ray of the left knee showed some possible foreign body.  Clinically has a healing scab  h/o left nephrectomy Traumatic at the age of 66  Recurrent fall syncope.  Seen by physical therapy and recommended outpatient PT on discharge.

## 2023-12-10 DIAGNOSIS — S065XAA Traumatic subdural hemorrhage with loss of consciousness status unknown, initial encounter: Secondary | ICD-10-CM | POA: Diagnosis not present

## 2023-12-10 LAB — CULTURE, BLOOD (ROUTINE X 2)
Culture: NO GROWTH
Culture: NO GROWTH
Special Requests: ADEQUATE

## 2023-12-10 LAB — CBC
HCT: 41.3 % (ref 39.0–52.0)
Hemoglobin: 14.6 g/dL (ref 13.0–17.0)
MCH: 34.5 pg — ABNORMAL HIGH (ref 26.0–34.0)
MCHC: 35.4 g/dL (ref 30.0–36.0)
MCV: 97.6 fL (ref 80.0–100.0)
Platelets: 360 K/uL (ref 150–400)
RBC: 4.23 MIL/uL (ref 4.22–5.81)
RDW: 11.9 % (ref 11.5–15.5)
WBC: 20.5 K/uL — ABNORMAL HIGH (ref 4.0–10.5)
nRBC: 0 % (ref 0.0–0.2)

## 2023-12-10 LAB — BASIC METABOLIC PANEL WITH GFR
Anion gap: 10 (ref 5–15)
BUN: 16 mg/dL (ref 6–20)
CO2: 24 mmol/L (ref 22–32)
Calcium: 9.2 mg/dL (ref 8.9–10.3)
Chloride: 99 mmol/L (ref 98–111)
Creatinine, Ser: 0.92 mg/dL (ref 0.61–1.24)
GFR, Estimated: >60 mL/min (ref >60)
Glucose, Bld: 116 mg/dL — ABNORMAL HIGH (ref 70–99)
Potassium: 4.3 mmol/L (ref 3.5–5.1)
Sodium: 133 mmol/L — ABNORMAL LOW (ref 135–145)

## 2023-12-10 LAB — PHOSPHORUS: Phosphorus: 3.8 mg/dL (ref 2.5–4.6)

## 2023-12-10 LAB — MAGNESIUM: Magnesium: 2.1 mg/dL (ref 1.7–2.4)

## 2023-12-10 MED ORDER — ENSURE PLUS HIGH PROTEIN PO LIQD
237.0000 mL | Freq: Three times a day (TID) | ORAL | Status: DC
  Administered 2023-12-10 – 2023-12-12 (×5): 237 mL via ORAL

## 2023-12-10 MED ORDER — ADULT MULTIVITAMIN W/MINERALS CH
1.0000 | ORAL_TABLET | Freq: Every day | ORAL | Status: DC
  Administered 2023-12-10 – 2023-12-12 (×3): 1 via ORAL
  Filled 2023-12-10 (×3): qty 1

## 2023-12-10 NOTE — Progress Notes (Signed)
 Patient ID: [name], male   DOB: 09/14/1965, 58 y.o.   MRN: [id number] Spoke at some length with the patient and his wife.  Answered questions they had regarding atherosclerotic findings at the base of the skull.  These are minimal findings on the CT scan and I pointed them out to his wife.  It suggest that he has other atherosclerotic disease that may need to be addressed more globally but nothing specific regarding his head injury.  Also demonstrated the blood clots in his brain which have been unchanging over the scans.  At this point he will need some pain management for dealing with the headaches as I believe we are maximizing all medical therapy for that.  I can follow-up as subdural hematoma to see that it resolves in about 3 weeks with an outpatient CT scan.  They are requesting some pain management for his migraine type headaches.  This can be arranged with pain management or neurology who may be more expert at doing these things.  I believe he is otherwise ready for discharge.

## 2023-12-10 NOTE — TOC Progression Note (Signed)
 Transition of Care [facility]) - Progression Note    Patient Details  Name: [name] MRN: [id number] Date of Birth: [birth year]  Transition of Care [facility]) CM/SW Contact  Andrez JULIANNA George, RN Phone Number: 12/10/2023, 12:39 PM  Clinical Narrative:     CM met with the patient and his spouse. Spouse has concerns about returning home and pt unable to work. CM notified Financial counseling and they are following up with the disability. Wife with questions about food stamps and utility assistance. CM printed off information from [facility] and will provide to patient's spouse on food pantries and utility assistance. CM has referred her to DSS to get food stamps started.  Wife asking to see dietician. CM has sent the dietician a message and she plans to see her this afternoon. Wife requesting an outpatient neurology appt at d/c and for pt to be prescribed protein drinks for home. CM has sent this information to Dr Sonjia.  Oxygen ordered for nocturnal use. CM has sent the orders to [facility] with the required documentation. Oxygen to be delivered to his home at dc.  Walker for home is at the bedside.   IP care management following.   Expected Discharge Plan: OP Rehab Barriers to Discharge: Continued Medical Work up               Expected Discharge Plan and Services   Discharge Planning Services: CM Consult   Living arrangements for the past 2 months: Single Family Home                 DME Arranged: Walker rolling DME Agency: [employer] Date DME Agency Contacted: 12/04/23 Time DME Agency Contacted: 1220 Representative spoke with at DME Agency: [place]             Social Drivers of Health (SDOH) Interventions SDOH Screenings   Food Insecurity: Food Insecurity Present (12/02/2023)  Housing: High Risk (12/02/2023)  Transportation Needs: Unmet Transportation Needs (12/02/2023)  Utilities: Not At Risk (12/02/2023)  Depression (PHQ2-9): Low Risk  (09/30/2023)   Tobacco Use: High Risk (12/01/2023)    Readmission Risk Interventions     No data to display

## 2023-12-10 NOTE — Discharge Instructions (Signed)

## 2023-12-10 NOTE — Progress Notes (Signed)
 Initial Nutrition Assessment  DOCUMENTATION CODES:  Not applicable  INTERVENTION:  Continue current diet as ordered Encourage PO intake Ensure Plus High Protein po TID, each supplement provides 350 kcal and 20 grams of protein MVI with minerals daily Added education to AVS about small frequent meals  NUTRITION DIAGNOSIS:  Inadequate oral intake related to decreased appetite as evidenced by per patient/family report.  GOAL:  Patient will meet greater than or equal to 90% of their needs  MONITOR:  PO intake, Supplement acceptance, Labs  REASON FOR ASSESSMENT:  Rounds    ASSESSMENT:  Pt with hx of HTN, left nephrectomy, diverticulosis, and dysautonomia presented to ED after a fall at home. Imaging in ED showed SDH and pt with laceration to the forehead.   Altered by TOC that pt and wife have questions and would like to discuss  Pt resting in bed at the time of assessment with wife at bedside. Pt drowsy and states he has a headache today. Discussed intake and overall nutrition status this admission and over the last few weeks prior to admission.   Pt reports that decreased intake has worsened over the course of admission. Is only ordering small meals (soup and sandwich) and only eating half his trays. Pt is agreeable to receiving nutrition supplements. Wife reports decreased intake has been slowly getting worse the last few weeks. Pt also reports that prior to admission he had been feeling a little weaker and had been dealing with some GI issues.   On exam, overall, pt appears well nourished but does have some mild muscle wasting present which is likely acutely acquired. Pt reports a usual weight of 235 lb. No measured weight has been taken this admission, current weight is copied from previous encounter. On bed scale, wt currently showing ~232 lbs. Likely lower than this as several extra items present on bed at the time. Pt at risk for developing malnutrition if intake does not  improve.   Admit weight: 107.5 kg - copied from last encounter Current weight: 105.5 kg   ~ 2% weight loss noted in the last month, not severe but not intentional and in the context of decreased intake which is concerning.  Average Meal Intake: 11/5-11/9: 75% intake x 2 recorded meals  Nutritionally Relevant Medications: Scheduled Meds:  dexamethasone  2 mg Oral Q12H   Ensure Plus High Protein  237 mL Oral TID BM   lactulose  10 g Oral TID   pantoprazole  40 mg Oral Daily   senna-docusate  1 tablet Oral QHS   PRN Meds: ondansetron , polyethylene glycol, prochlorperazine  Labs Reviewed: Sodium 133 CBG ranges from 116-134 mg/dL over the last 24 hours  NUTRITION - FOCUSED PHYSICAL EXAM: Flowsheet Row Most Recent Value  Orbital Region No depletion  Upper Arm Region No depletion  Thoracic and Lumbar Region No depletion  Buccal Region No depletion  Temple Region Mild depletion  Clavicle Bone Region Mild depletion  Clavicle and Acromion Bone Region Mild depletion  Scapular Bone Region Mild depletion  Dorsal Hand No depletion  Patellar Region No depletion  Anterior Thigh Region No depletion  Posterior Calf Region No depletion  Edema (RD Assessment) Mild  Hair Reviewed  Eyes Reviewed  Mouth Reviewed  Skin Reviewed  Nails Reviewed    Diet Order:   Diet Order             Diet regular Room service appropriate? Yes; Fluid consistency: Thin  Diet effective now  EDUCATION NEEDS:  Education needs have been addressed  Skin:  Skin Assessment: Reviewed RN Assessment  Last BM:  11/12  Height:  Ht Readings from Last 1 Encounters:  12/01/23 6' 2 (1.88 m)    Weight:  Wt Readings from Last 1 Encounters:  12/10/23 105.5 kg    Ideal Body Weight:  86.4 kg  BMI:  Body mass index is 29.86 kg/m.  Estimated Nutritional Needs:  Kcal:  2200-2500 kcal/d Protein:  110-125g/d Fluid:  >/=2.2L/d    [name], RD, LDN, CNSC Registered Dietitian  II Please reach out via secure chat

## 2023-12-10 NOTE — Progress Notes (Signed)
 Speech Language Pathology Treatment: Cognitive-Linguistic  Patient Details Name: MARISA HUFSTETLER MRN: [id number] DOB: Aug 17, 1965 Today's Date: 12/10/2023 Time: 8576-8553 SLP Time Calculation (min) (ACUTE ONLY): 23 min  Assessment / Plan / Recommendation Clinical Impression  Pt seen with wife present focusing on use of memory strategies. His wife has been utilizing memory notebook to write important information and he has been referring to her to recall information. Encouraged wife to have pt write information and use to locate information (if he is not alert enough when MD's arrive, wife can take separate notes and have pt write when he is more alert). He wrote strategies to manage orthostatic symptoms learned from PTA and recalled with 90% acc). Required min prompts to use for prospective memory in which he wrote a question related to neurology consult. He had been unable to recall the day he fell/admission and recalled without cues however requested he record it for reference if needed. SLP offered supportive listening as pt became teary when speaking about his anxiety and dealing with current situation. They inquired about outpatient ST (previously recommended) at [facility] location and SLP stated would ask. [place], SW was not certain they offered ST but [facility] does and would check with case manager who could discuss with pt.    HPI HPI: DENZIL MCEACHRON is a 58 y.o. male with medical history significant of hypertension, chronic back pain, GERD who presents to the emergency department via EMS after an unwitnessed fall on the driveway where he sustained a right forehead scalp laceration and hematoma      SLP Plan  Continue with current plan of care          Recommendations   Min 2/week, recommend outpatient ST                  Oral care BID   Intermittent Supervision/Assistance Cognitive communication deficit (M58.158)     Continue with current plan of care     Dustin Olam Bull  12/10/2023, 2:56 PM

## 2023-12-10 NOTE — Progress Notes (Signed)
 PROGRESS NOTE  [name] FMW:[id number] DOB: [birth year] DOA: 12/01/2023 PCP: Maree Isles, MD   LOS: 8 days   Brief narrative:  58 y.o. male with past medical history significant for HTN, chronic back pain, GERD, anxiety, h/o left nephrectomy, recurrent syncope, falls secondary to dysautonomia and orthostatic hypotension presented to the hospital initially at EMS after unwitnessed fall covered in blood after having a drink at the neighbors house.  EMS noted right forehead scalp laceration, hematoma, he was put on a c-collar and was brought into the hospital.  In the ED, patient was hypothermic at 94 degrees, was noted to have approximately 5 to 6 mm laceration to left forehead that required sutures.  CT head scan showed acute bilateral convexity subdural hematoma left more than the right with associated interhemispheric/ para falcine hemorrhage and possible trace 1-2 mm rightward shift. Small subarachnoid hemorrhage at the right frontal vertex. Globular extra-axial collections at the right vertex, possibly focal subdural or subarachnoid blood, measuring up to 8 mm, Moderate right forehead scalp laceration and hematoma.  CT cervical spine showed questionable nondisplaced C4 spinous process fracture and CT scan of the maxillofacial area showed mildly depressed nasal bone fracture.  CT scan of the chest abdomen and pelvis without any findings of trauma.  Initially patient was seen by trauma surgery and neurosurgery was also consulted.       Assessment/Plan: Principal Problem:   Subdural hematoma (HCC)   Traumatic head, neck, facial injury -Bilateral subdural hematoma (L > R ) -Subarachnoid hemorrhage -Nondisplaced C4 spinous process fracture -Right forehead scalp laceration and hematoma Secondary to falls.  UDS positive for benzos.  Trauma surgery and neurosurgery on board.  CT head stable for SDH and subarachnoid hemorrhage.  Dr. Lenox recommended Decadron 2 mg p.o. twice daily for 10  days and then taper off; continue conservative treatment as per neurosurgery. Regarding cervical spine findings, per Dr. Colon, in absence of cervical tenderness he would not suggest surgical intervention or even c-collar.  Continue pain management.  On Tylenol  Robaxin Dilaudid , plan to minimize Dilaudid  buit has requiring it. On oxycodone  and Fioricet as well.  Patient is spouse wishes to discuss about subdural hematoma with neurosurgery today    History of recurrent syncope, fall Secondary to dysautonomia and orthostatic hypotension which is chronic.  Continue TED hose.  Has been started on Decadron for headache.   Hypertension with orthostatic hypotension Blood pressure was elevated so patient was started on amlodipine 2.5 mg daily patient does have orthostatic hypotension and will continue to monitor but was not much asymptomatic today.   Nocturnal hypoxemia Nocturnal oxygen will be prescribed on discharge   H/o diverticulosis S/p colectomy, colostomy reversal Continue bowel regimen   Right chest wall pain Rib x-ray was negative for rib fracture.   Alcohol intoxication Initial alcohol level was 285.  Occasionally drinks.   Hyponatremia Mild at this time.  Latest sodium of 133.  Received IV fluids.  Possibility of SIADH.  Prozac has been initiated.  Will continue to monitor.  Chronic back pain  anxiety/depression Patient was on BuSpar fluoxetine Seroquel and Xanax and Neurontin as outpatient.  Prozac, Seroquel, Xanax, buspirone on board  COPD Continue bronchodilators   Left knee pain X-ray of the left knee showed some possible foreign body.  Clinically has a healing scab  h/o left nephrectomy Traumatic at the age of 26  Recurrent fall syncope.  Seen by physical therapy and recommended outpatient PT on discharge.  DVT prophylaxis: Place and maintain sequential  compression device Start: 12/08/23 0928 SCDs Start: 12/02/23 0410   Disposition: Outpatient PT on discharge  likely 12/11/2022  Status is: Inpatient Remains inpatient appropriate because: Pending clinical improvement,    Code Status:     Code Status: Full Code  Family Communication: Spoke with patient's spouse at bedside  Consultants: Neurosurgery  Procedures: None  Anti-infectives:  None  Anti-infectives (From admission, onward)    None        Subjective: Today, patient was seen and examined at bedside.  Still complains o headache despite oxycodone  on IV Dilaudid .  Patient's spouse at bedside concerned for subdural hematoma forgetfulness and memory with orthostatic hypotension. Objective: Vitals:   12/10/23 0843 12/10/23 1208  BP: (!) 161/96 (!) (P) 156/92  Pulse: 66 (P) 78  Resp: 17 (P) 19  Temp: 97.8 F (36.6 C) (P) 97.6 F (36.4 C)  SpO2:  (P) 100%   No intake or output data in the 24 hours ending 12/10/23 1409 Filed Weights   12/01/23 2231 12/10/23 1408  Weight: 107.5 kg 105.5 kg   Body mass index is 29.86 kg/m.   Physical Exam: GENERAL: Patient is alert awake and oriented. Not in obvious distress. HENT: No scleral pallor or icterus. Pupils equally reactive to light. Oral mucosa is moist NECK: is supple, no gross swelling noted. CHEST: Clear to auscultation. No crackles or wheezes.  Diminished breath sounds bilaterally. CVS: S1 and S2 heard, no murmur. Regular rate and rhythm.  ABDOMEN: Soft, non-tender, bowel sounds are present. EXTREMITIES: No edema. CNS: Cranial nerves are intact. No focal motor deficits. SKIN: warm and dry without rashes.  Data Review: I have personally reviewed the following laboratory data and studies,  CBC: Recent Labs  Lab 12/05/23 0858 12/06/23 0105 12/07/23 0228 12/09/23 0123 12/10/23 0428  WBC 18.1* 16.7* 16.0* 17.2* 20.5*  NEUTROABS  --  10.9* 11.1*  --   --   HGB 14.3 14.1 13.8 14.4 14.6  HCT 41.3 40.9 40.1 41.5 41.3  MCV 100.7* 100.7* 100.0 98.3 97.6  PLT 237 265 282 329 360   Basic Metabolic Panel: Recent  Labs  Lab 12/05/23 0858 12/06/23 0105 12/07/23 0228 12/09/23 0123 12/10/23 0428  NA 131* 132* 130* 133* 133*  K 3.7 4.0 3.8 3.9 4.3  CL 97* 96* 98 99 99  CO2 22 26 25 24 24   GLUCOSE 130* 115* 117* 134* 116*  BUN 11 10 8 13 16   CREATININE 0.92 0.95 0.78 0.93 0.92  CALCIUM 8.6* 8.5* 8.6* 9.2 9.2  MG  --   --   --   --  2.1  PHOS  --   --   --   --  3.8   Liver Function Tests: Recent Labs  Lab 12/05/23 0858 12/06/23 0105 12/07/23 0228 12/09/23 0123  AST 21 20 23 23   ALT 25 24 25 25   ALKPHOS 72 71 65 68  BILITOT 1.2 0.9 0.9 0.7  PROT 6.1* 6.2* 6.0* 6.6  ALBUMIN 2.9* 2.8* 2.8* 3.0*   No results for input(s): LIPASE, AMYLASE in the last 168 hours. Recent Labs  Lab 12/05/23 1319  AMMONIA 46*   Cardiac Enzymes: No results for input(s): CKTOTAL, CKMB, CKMBINDEX, TROPONINI in the last 168 hours. BNP (last 3 results) Recent Labs    12/06/23 0105  BNP 50.0    ProBNP (last 3 results) No results for input(s): PROBNP in the last 8760 hours.  CBG: No results for input(s): GLUCAP in the last 168 hours. Recent Results (from the past 240  hours)  Culture, blood (Routine X 2) w Reflex to ID Panel     Status: None   Collection Time: 12/05/23 11:03 AM   Specimen: BLOOD RIGHT ARM  Result Value Ref Range Status   Specimen Description BLOOD RIGHT ARM  Final   Special Requests   Final    BOTTLES DRAWN AEROBIC AND ANAEROBIC Blood Culture adequate volume   Culture   Final    NO GROWTH 5 DAYS Performed at [facility], [address], [place], [place] [address]    Report Status 12/10/2023 FINAL  Final  Culture, blood (Routine X 2) w Reflex to ID Panel     Status: None   Collection Time: 12/05/23 11:03 AM   Specimen: BLOOD RIGHT HAND  Result Value Ref Range Status   Specimen Description BLOOD RIGHT HAND  Final   Special Requests   Final    BOTTLES DRAWN AEROBIC AND ANAEROBIC Blood Culture results may not be optimal due to an inadequate volume of blood  received in culture bottles   Culture   Final    NO GROWTH 5 DAYS Performed at [facility], [address], [place], [place] [address]    Report Status 12/10/2023 FINAL  Final  MRSA Next Gen by PCR, Nasal     Status: None   Collection Time: 12/05/23 12:37 PM  Result Value Ref Range Status   MRSA by PCR Next Gen NOT DETECTED NOT DETECTED Final    Comment: (NOTE) The GeneXpert MRSA Assay (FDA approved for NASAL specimens only), is one component of a comprehensive MRSA colonization surveillance program. It is not intended to diagnose MRSA infection nor to guide or monitor treatment for MRSA infections. Test performance is not FDA approved in patients less than 70 years old. Performed at [facility], [address] [place], [place] [address]      Studies: No results found.     Angell Pincock, MD  [facility] 12/10/2023  If 7PM-7AM, please contact night-coverage

## 2023-12-11 DIAGNOSIS — S065XAA Traumatic subdural hemorrhage with loss of consciousness status unknown, initial encounter: Secondary | ICD-10-CM | POA: Diagnosis not present

## 2023-12-11 LAB — CBC
HCT: 42.5 % (ref 39.0–52.0)
Hemoglobin: 14.6 g/dL (ref 13.0–17.0)
MCH: 34.2 pg — ABNORMAL HIGH (ref 26.0–34.0)
MCHC: 34.4 g/dL (ref 30.0–36.0)
MCV: 99.5 fL (ref 80.0–100.0)
Platelets: 397 K/uL (ref 150–400)
RBC: 4.27 MIL/uL (ref 4.22–5.81)
RDW: 12.1 % (ref 11.5–15.5)
WBC: 18.7 K/uL — ABNORMAL HIGH (ref 4.0–10.5)
nRBC: 0 % (ref 0.0–0.2)

## 2023-12-11 LAB — BASIC METABOLIC PANEL WITH GFR
Anion gap: 8 (ref 5–15)
BUN: 16 mg/dL (ref 6–20)
CO2: 25 mmol/L (ref 22–32)
Calcium: 9 mg/dL (ref 8.9–10.3)
Chloride: 100 mmol/L (ref 98–111)
Creatinine, Ser: 0.86 mg/dL (ref 0.61–1.24)
GFR, Estimated: >60 mL/min (ref >60)
Glucose, Bld: 114 mg/dL — ABNORMAL HIGH (ref 70–99)
Potassium: 4.2 mmol/L (ref 3.5–5.1)
Sodium: 133 mmol/L — ABNORMAL LOW (ref 135–145)

## 2023-12-11 MED ORDER — SALINE SPRAY 0.65 % NA SOLN
1.0000 | NASAL | Status: DC | PRN
  Filled 2023-12-11: qty 44

## 2023-12-11 NOTE — Plan of Care (Signed)

## 2023-12-11 NOTE — Progress Notes (Signed)
 PROGRESS NOTE  [name] FMW:[id number] DOB: 09/13/1965 DOA: 12/01/2023 PCP: Maree Isles, MD   LOS: 9 days   Brief narrative:  58 y.o. male with past medical history significant for HTN, chronic back pain, GERD, anxiety, h/o left nephrectomy, recurrent syncope, falls secondary to dysautonomia and orthostatic hypotension presented to the hospital initially at EMS after unwitnessed fall covered in blood after having a drink at the neighbors house.  EMS noted right forehead scalp laceration, hematoma, he was put on a c-collar and was brought into the hospital.  In the ED, patient was hypothermic at 94 degrees, was noted to have approximately 5 to 6 mm laceration to left forehead that required sutures.  CT head scan showed acute bilateral convexity subdural hematoma left more than the right with associated interhemispheric/ para falcine hemorrhage and possible trace 1-2 mm rightward shift. Small subarachnoid hemorrhage at the right frontal vertex. Globular extra-axial collections at the right vertex, possibly focal subdural or subarachnoid blood, measuring up to 8 mm, Moderate right forehead scalp laceration and hematoma.  CT cervical spine showed questionable nondisplaced C4 spinous process fracture and CT scan of the maxillofacial area showed mildly depressed nasal bone fracture.  CT scan of the chest abdomen and pelvis without any findings of trauma.  Initially patient was seen by trauma surgery and neurosurgery was also consulted.       Assessment/Plan: Principal Problem:   Subdural hematoma (HCC)   Traumatic head, neck, facial injury -Bilateral subdural hematoma (L > R ) -Subarachnoid hemorrhage -Nondisplaced C4 spinous process fracture -Right forehead scalp laceration and hematoma Secondary to falls.  UDS positive for benzos.  Trauma surgery and neurosurgery on board.  CT head stable for SDH and subarachnoid hemorrhage.  Dr. Lenox recommended Decadron 2 mg p.o. twice daily for 10  days and then taper off; continue conservative treatment as per neurosurgery. Regarding cervical spine findings, per Dr. Colon, in absence of cervical tenderness he would not suggest surgical intervention or even c-collar.  Continue pain management.  On Tylenol  Robaxin Dilaudid , plan to minimize Dilaudid  buit has requiring it. On oxycodone  and Fioricet as well.  Patient is spouse wishes to discuss about subdural hematoma with neurosurgery today   Intractable headache.  Patient continues to have severe headache requiring Dilaudid  oxycodone  and multiple medications.  Already on dexamethasone.  Patient likely has history of migraine which might have exacerbated.  Will try a migraine talk cocktail today if continues to have pain.  Will discontinue Dilaudid  IV. I had a prolonged discussion with the patient and patient's wife at bedside   History of recurrent syncope, fall Secondary to dysautonomia and orthostatic hypotension which is chronic.  Continue TED hose.     Hypertension with orthostatic hypotension Blood pressure was elevated so patient was started on amlodipine 2.5 mg daily patient does have orthostatic hypotension.  Orthostatic precautions were discussed with the patient at length.   Nocturnal hypoxemia Nocturnal oxygen will be prescribed on discharge.  TOC has already arranged this.   H/o diverticulosis S/p colectomy, colostomy reversal Continue bowel regimen   Right chest wall pain Rib x-ray was negative for rib fracture.   Alcohol intoxication Initial alcohol level was 285.  Occasionally drinks.   Hyponatremia Mild at this time.  Latest sodium of 133.  Received IV fluids.  Possibility of SIADH.  Prozac has been initiated.  Will continue to monitor.  Chronic back pain  anxiety/depression Patient was on BuSpar fluoxetine Seroquel and Xanax and Neurontin as outpatient.  Prozac, Seroquel,  Xanax, buspirone on board  COPD Continue bronchodilators   Left knee pain Improved  h/o  left nephrectomy Traumatic at the age of 33  Recurrent fall syncope.  Seen by physical therapy and recommended outpatient PT on discharge.  DVT prophylaxis: Place and maintain sequential compression device Start: 12/08/23 0928 SCDs Start: 12/02/23 0410   Disposition: Outpatient PT on discharge likely 12/12/2022  Status is: Inpatient Remains inpatient appropriate because: Pending clinical improvement, ongoing headache,    Code Status:     Code Status: Full Code  Family Communication: Spoke with patient's spouse at bedside  Consultants: Neurosurgery  Procedures: None  Anti-infectives:  None  Anti-infectives (From admission, onward)    None        Subjective: Today, patient was seen and examined at bedside.  Patient complains of intractable headache requiring multiple rounds of IV Phenergan and Dilaudid  and oxycodone  overnight.    Objective: Vitals:   12/11/23 0855 12/11/23 1115  BP: (!) 158/94 (!) 159/90  Pulse:  66  Resp:  20  Temp:  97.6 F (36.4 C)  SpO2:  95%    Intake/Output Summary (Last 24 hours) at 12/11/2023 1419 Last data filed at 12/11/2023 0600 Gross per 24 hour  Intake 360 ml  Output --  Net 360 ml   Filed Weights   12/01/23 2231 12/10/23 1408  Weight: 107.5 kg 105.5 kg   Body mass index is 29.86 kg/m.   Physical Exam: GENERAL: Patient is alert awake and oriented. Not in obvious distress. HENT: No scleral pallor or icterus. Pupils equally reactive to light. Oral mucosa is moist NECK: is supple, no gross swelling noted. CHEST: Clear to auscultation. No crackles or wheezes.  Diminished breath sounds bilaterally. CVS: S1 and S2 heard, no murmur. Regular rate and rhythm.  ABDOMEN: Soft, non-tender, bowel sounds are present. EXTREMITIES: No edema. CNS: Cranial nerves are intact. No focal motor deficits. SKIN: warm and dry without rashes.  Data Review: I have personally reviewed the following laboratory data and  studies,  CBC: Recent Labs  Lab 12/06/23 0105 12/07/23 0228 12/09/23 0123 12/10/23 0428 12/11/23 0434  WBC 16.7* 16.0* 17.2* 20.5* 18.7*  NEUTROABS 10.9* 11.1*  --   --   --   HGB 14.1 13.8 14.4 14.6 14.6  HCT 40.9 40.1 41.5 41.3 42.5  MCV 100.7* 100.0 98.3 97.6 99.5  PLT 265 282 329 360 397   Basic Metabolic Panel: Recent Labs  Lab 12/06/23 0105 12/07/23 0228 12/09/23 0123 12/10/23 0428 12/11/23 0434  NA 132* 130* 133* 133* 133*  K 4.0 3.8 3.9 4.3 4.2  CL 96* 98 99 99 100  CO2 26 25 24 24 25   GLUCOSE 115* 117* 134* 116* 114*  BUN 10 8 13 16 16   CREATININE 0.95 0.78 0.93 0.92 0.86  CALCIUM 8.5* 8.6* 9.2 9.2 9.0  MG  --   --   --  2.1  --   PHOS  --   --   --  3.8  --    Liver Function Tests: Recent Labs  Lab 12/05/23 0858 12/06/23 0105 12/07/23 0228 12/09/23 0123  AST 21 20 23 23   ALT 25 24 25 25   ALKPHOS 72 71 65 68  BILITOT 1.2 0.9 0.9 0.7  PROT 6.1* 6.2* 6.0* 6.6  ALBUMIN 2.9* 2.8* 2.8* 3.0*   No results for input(s): LIPASE, AMYLASE in the last 168 hours. Recent Labs  Lab 12/05/23 1319  AMMONIA 46*   Cardiac Enzymes: No results for input(s): CKTOTAL, CKMB, CKMBINDEX,  TROPONINI in the last 168 hours. BNP (last 3 results) Recent Labs    12/06/23 0105  BNP 50.0    ProBNP (last 3 results) No results for input(s): PROBNP in the last 8760 hours.  CBG: No results for input(s): GLUCAP in the last 168 hours. Recent Results (from the past 240 hours)  Culture, blood (Routine X 2) w Reflex to ID Panel     Status: None   Collection Time: 12/05/23 11:03 AM   Specimen: BLOOD RIGHT ARM  Result Value Ref Range Status   Specimen Description BLOOD RIGHT ARM  Final   Special Requests   Final    BOTTLES DRAWN AEROBIC AND ANAEROBIC Blood Culture adequate volume   Culture   Final    NO GROWTH 5 DAYS Performed at [facility], [address], [place], [place] [address]    Report Status 12/10/2023 FINAL  Final  Culture, blood  (Routine X 2) w Reflex to ID Panel     Status: None   Collection Time: 12/05/23 11:03 AM   Specimen: BLOOD RIGHT HAND  Result Value Ref Range Status   Specimen Description BLOOD RIGHT HAND  Final   Special Requests   Final    BOTTLES DRAWN AEROBIC AND ANAEROBIC Blood Culture results may not be optimal due to an inadequate volume of blood received in culture bottles   Culture   Final    NO GROWTH 5 DAYS Performed at [facility], [address], [place], [place] [address]    Report Status 12/10/2023 FINAL  Final  MRSA Next Gen by PCR, Nasal     Status: None   Collection Time: 12/05/23 12:37 PM  Result Value Ref Range Status   MRSA by PCR Next Gen NOT DETECTED NOT DETECTED Final    Comment: (NOTE) The GeneXpert MRSA Assay (FDA approved for NASAL specimens only), is one component of a comprehensive MRSA colonization surveillance program. It is not intended to diagnose MRSA infection nor to guide or monitor treatment for MRSA infections. Test performance is not FDA approved in patients less than 54 years old. Performed at [facility], [address] [place], [place] [address]      Studies: No results found.     Daniel Weisensel, MD  [facility] 12/11/2023  If 7PM-7AM, please contact night-coverage

## 2023-12-11 NOTE — Progress Notes (Signed)
 Occupational Therapy Treatment Patient Details Name: [name] MRN: [id number] DOB: 1965-02-06 Today's Date: 12/11/2023   History of present illness Patient is a 58 y/o male admitted 12/01/23 found down on driveway by wife.  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  CT c-spine positive for nondisplaced C4 spinous process fx.  PMH HTN, chronic back pain, GERD, h/o L nephrectomy and recent work up for dysautonomia and orthostatic hypotension.   OT comments  Pt greeted in bed, agreeable for OT visit. Wife, Katheryn, at bedside repeating that pt did not want to get up today due to ongoing headache pain/poor pain mgmt. Pt pleasant & agreeable to participate with OT. Focus of today's session on memory & organizational compensatory strategies and completing scavenger hunt. Pt able to write down and locate 100% of items. Able to verbally report ~80% of items with delayed recall. Emphasized maintaining good routine (AM/PM) and using sticky notes/colored tape to outline & label locations for specific items in home environment to improve organization (TV remote, cell phone, glasses, water bottle, etc.). Pt & wife receptive to this.   Observed wife often interrupting discussion between pt and OT and ultimately distracting pt. Offered pt's wife to leave the room during tx so she could have some respite (reports she feels she cannot leave his side because he can't remember anything or do anything for himself). She stated multiple concerns re: pt's safety, BP mgmt, having HHOT, then stating OPOT, etc. Very tangential and appears overwhelmed herself. Spoke with pt's wife re: caregiver burden, reports she cared for her previous husband in the hospital years ago and has felt very stressed/overwhelmed. Offered therapeutic listening and support; would benefit from  any caregiver burden resources. Will continue to follow.      If plan is discharge home, recommend the following:  A little help with bathing/dressing/bathroom;Assistance with cooking/housework;Direct supervision/assist for medications management;Direct supervision/assist for financial management;Assist for transportation;Supervision due to cognitive status   Equipment Recommendations  None recommended by OT    Recommendations for Other Services      Precautions / Restrictions Precautions Precautions: Fall Recall of Precautions/Restrictions: Intact Precaution/Restrictions Comments: monitor BP, orthostatic, monitor SpO2 Restrictions Weight Bearing Restrictions Per Provider Order: No       Mobility Bed Mobility Overal bed mobility: Modified Independent Bed Mobility: Supine to Sit, Sit to Supine     Supine to sit: Modified independent (Device/Increase time), HOB elevated Sit to supine: Modified independent (Device/Increase time), HOB elevated   General bed mobility comments: Exited to the R side, used rails    Transfers Overall transfer level: Needs assistance Equipment used: Rolling walker (2 wheels), None Transfers: Sit to/from Stand Sit to Stand: Supervision           General transfer comment: for safety     Balance Overall balance assessment: Needs assistance Sitting-balance support: No upper extremity supported, Feet supported Sitting balance-Leahy Scale: Good Sitting balance - Comments: seated EOB   Standing balance support: No upper extremity supported, During functional activity, Bilateral upper extremity supported Standing balance-Leahy Scale: Fair Standing balance comment: ambulated short distance without AD and with RW in room with no change in balance/LOB                           ADL either performed or assessed with clinical judgement   ADL Overall  ADL's : Needs assistance/impaired     Grooming: Supervision/safety;Standing;Wash/dry  hands Grooming Details (indicate cue type and reason): cog cues to maintain attention for proper sequencing                 Toilet Transfer: Supervision/safety;Ambulation;Regular Toilet;Rolling walker (2 wheels) Toilet Transfer Details (indicate cue type and reason): used grab bars Toileting- Clothing Manipulation and Hygiene: Supervision/safety;Sitting/lateral lean Toileting - Clothing Manipulation Details (indicate cue type and reason): anterior peri hygiene     Functional mobility during ADLs: Contact guard assist;Rolling walker (2 wheels)      Extremity/Trunk Assessment              Vision       Perception     Praxis     Communication Communication Communication: No apparent difficulties   Cognition Arousal: Alert Behavior During Therapy: WFL for tasks assessed/performed Cognition: Cognition impaired     Awareness: Online awareness impaired (intellectual awareness appears to be improving) Memory impairment (select all impairments): Short-term memory, Working memory Attention impairment (select first level of impairment): Sustained attention, Selective attention Executive functioning impairment (select all impairments): Sequencing, Reasoning, Problem solving, Organization OT - Cognition Comments: pt with reduced recall, often gets distracted by other people in room, wife often answers for pt and inerrupts making his undivided attention difficult to maintain, pt gets frustrated when wife interrupts               [employer] Scales of Cognitive Functioning: Purposeful, Appropriate: Stand-by Assistance [VIII] Following commands: Intact        Cueing   Cueing Techniques: Verbal cues  Exercises      Shoulder Instructions       General Comments wife, Katheryn, at bedside; pt often removing O2 [place] t/o session with SpO2 desatting on RA    Pertinent Vitals/ Pain       Pain Assessment Pain Assessment: Faces Pain Score: 7  Pain Location: HA Pain  Descriptors / Indicators: Headache Pain Intervention(s): Monitored during session, Repositioned  Home Living                                          Prior Functioning/Environment              Frequency  Min 2X/week        Progress Toward Goals  OT Goals(current goals can now be found in the care plan section)  Progress towards OT goals: Progressing toward goals     Plan      Co-evaluation                 AM-PAC OT 6 Clicks Daily Activity     Outcome Measure   Help from another person eating meals?: None Help from another person taking care of personal grooming?: A Little Help from another person toileting, which includes using toliet, bedpan, or urinal?: A Little Help from another person bathing (including washing, rinsing, drying)?: A Little Help from another person to put on and taking off regular upper body clothing?: A Little Help from another person to put on and taking off regular lower body clothing?: A Little 6 Click Score: 19    End of Session Equipment Utilized During Treatment: Gait belt;Rolling walker (2 wheels);Oxygen  OT Visit Diagnosis: Unsteadiness on feet (R26.81);Repeated falls (R29.6);History of falling (Z91.81);Cognitive communication deficit (R41.841);Dizziness and giddiness (R42);Pain Symptoms and signs involving cognitive functions:  Nontraumatic SAH (SDH) Pain - part of body:  (head)   Activity Tolerance Patient tolerated treatment well   Patient Left in bed;with call bell/phone within reach;with bed alarm set;with family/visitor present   Nurse Communication Mobility status        Time: 8647-8562 OT Time Calculation (min): 45 min  Charges: OT General Charges $OT Visit: 1 Visit OT Treatments $Therapeutic Activity: 23-37 mins $Cognitive Funtion inital: Initial 15 mins  Kaiya Boatman M. [place], OTR/L [facility] Acute Rehabilitation Services [phone] Secure Chat Preferred  Tristain Daily 12/11/2023,  4:28 PM

## 2023-12-11 NOTE — TOC Progression Note (Signed)
 Transition of Care [facility]) - Progression Note    Patient Details  Name: [name] MRN: [id number] Date of Birth: 04/29/1965  Transition of Care [facility] CM/SW Contact  Andrez JULIANNA George, RN Phone Number: 12/11/2023, 3:17 PM  Clinical Narrative:     Pts spouse asked that outpt therapy be changed to [facility]. CM has cancelled referral to [facility] outpt and faxed referral to [facility]. Information on the AVS.  Oxygen for home in the pts room. Wife will contact [employer] when home for delivery of concentrator.   Expected Discharge Plan: OP Rehab Barriers to Discharge: Continued Medical Work up               Expected Discharge Plan and Services   Discharge Planning Services: CM Consult   Living arrangements for the past 2 months: Single Family Home                 DME Arranged: Walker rolling DME Agency: [employer] Date DME Agency Contacted: 12/04/23 Time DME Agency Contacted: 1220 Representative spoke with at DME Agency: [place]             Social Drivers of Health (SDOH) Interventions SDOH Screenings   Food Insecurity: Food Insecurity Present (12/02/2023)  Housing: High Risk (12/02/2023)  Transportation Needs: Unmet Transportation Needs (12/02/2023)  Utilities: Not At Risk (12/02/2023)  Depression (PHQ2-9): Low Risk  (09/30/2023)  Tobacco Use: High Risk (12/01/2023)    Readmission Risk Interventions     No data to display

## 2023-12-12 ENCOUNTER — Other Ambulatory Visit (HOSPITAL_COMMUNITY): Payer: Self-pay

## 2023-12-12 DIAGNOSIS — R519 Headache, unspecified: Secondary | ICD-10-CM | POA: Insufficient documentation

## 2023-12-12 DIAGNOSIS — S065XAA Traumatic subdural hemorrhage with loss of consciousness status unknown, initial encounter: Secondary | ICD-10-CM | POA: Diagnosis not present

## 2023-12-12 MED ORDER — METOCLOPRAMIDE HCL 5 MG PO TABS
5.0000 mg | ORAL_TABLET | Freq: Every day | ORAL | 0 refills | Status: DC | PRN
  Filled 2023-12-12: qty 20, 20d supply, fill #0

## 2023-12-12 MED ORDER — NAPROXEN 250 MG PO TABS
500.0000 mg | ORAL_TABLET | Freq: Once | ORAL | Status: AC
  Administered 2023-12-12: 500 mg via ORAL
  Filled 2023-12-12: qty 2

## 2023-12-12 MED ORDER — OXYCODONE HCL 5 MG PO TABS
5.0000 mg | ORAL_TABLET | Freq: Four times a day (QID) | ORAL | 0 refills | Status: DC | PRN
  Filled 2023-12-12: qty 20, 5d supply, fill #0

## 2023-12-12 MED ORDER — ONDANSETRON HCL 4 MG PO TABS
4.0000 mg | ORAL_TABLET | Freq: Four times a day (QID) | ORAL | 0 refills | Status: DC | PRN
  Filled 2023-12-12: qty 20, 5d supply, fill #0

## 2023-12-12 MED ORDER — SUMATRIPTAN SUCCINATE 6 MG/0.5ML ~~LOC~~ SOLN
6.0000 mg | Freq: Once | SUBCUTANEOUS | Status: AC
  Administered 2023-12-12: 6 mg via SUBCUTANEOUS
  Filled 2023-12-12: qty 0.5

## 2023-12-12 MED ORDER — POLYETHYLENE GLYCOL 3350 17 GM/SCOOP PO POWD
17.0000 g | Freq: Every day | ORAL | 0 refills | Status: AC | PRN
  Filled 2023-12-12: qty 238, 14d supply, fill #0

## 2023-12-12 MED ORDER — AMLODIPINE BESYLATE 2.5 MG PO TABS
2.5000 mg | ORAL_TABLET | Freq: Every day | ORAL | 2 refills | Status: DC
  Filled 2023-12-12: qty 30, 30d supply, fill #0

## 2023-12-12 MED ORDER — METHOCARBAMOL 750 MG PO TABS
750.0000 mg | ORAL_TABLET | Freq: Three times a day (TID) | ORAL | 0 refills | Status: AC | PRN
  Filled 2023-12-12: qty 15, 5d supply, fill #0

## 2023-12-12 MED ORDER — SUMATRIPTAN SUCCINATE 50 MG PO TABS
50.0000 mg | ORAL_TABLET | Freq: Once | ORAL | 2 refills | Status: AC | PRN
  Filled 2023-12-12: qty 12, 22d supply, fill #0

## 2023-12-12 MED ORDER — METOCLOPRAMIDE HCL 5 MG/ML IJ SOLN
10.0000 mg | Freq: Once | INTRAMUSCULAR | Status: AC
  Administered 2023-12-12: 10 mg via INTRAVENOUS
  Filled 2023-12-12: qty 2

## 2023-12-12 MED ORDER — BUTALBITAL-APAP-CAFFEINE 50-325-40 MG PO TABS
1.0000 | ORAL_TABLET | Freq: Four times a day (QID) | ORAL | 0 refills | Status: AC | PRN
  Filled 2023-12-12: qty 14, 4d supply, fill #0

## 2023-12-12 MED ORDER — NAPROXEN 500 MG PO TABS
500.0000 mg | ORAL_TABLET | Freq: Every day | ORAL | 0 refills | Status: AC | PRN
  Filled 2023-12-12: qty 20, 20d supply, fill #0

## 2023-12-12 MED ORDER — ENSURE PLUS HIGH PROTEIN PO LIQD
237.0000 mL | Freq: Three times a day (TID) | ORAL | 0 refills | Status: AC
  Filled 2023-12-12: qty 10665, 15d supply, fill #0

## 2023-12-12 NOTE — Progress Notes (Signed)
 Reviewed AVS, patient expressed understanding of medications, MD follow up reviewed.   Removed IV, Site clean, dry and intact.  Patient states all belongings brought to the hospital at time of admission are accounted for and packed to take home.  Picked up medications from [facility] pharmacy. Nursing staff contacted to transport patient to  ED Dept entrance where family member was waiting in vehicle to transport home.

## 2023-12-12 NOTE — Progress Notes (Signed)
 PT Cancellation Note  Patient Details Name: [name] MRN: [id number] DOB: Aug 10, 1965   Cancelled Treatment:    Reason Eval/Treat Not Completed: (P) Fatigue/lethargy limiting ability to participate;Pain limiting ability to participate (spouse defer as pt resting and had trouble sleeping earlier due to migraine HA type pain. Pt woke up during attempt. Pt/spouse deny any mobility/PT type questions but do request to speak with case mgr about tub seat or bench DME they need for home.) Will continue efforts per PT plan of care as schedule permits.   Negan Grudzien M Maylene Crocker 12/12/2023, 1:20 PM

## 2023-12-12 NOTE — Progress Notes (Signed)
 Jermaine at [facility] contacted for a  tub bench at spouse's request.  [employer] has provided Oxygen and RW this week.  DME to be delivered to patient's room prior to d/c home.

## 2023-12-12 NOTE — Discharge Summary (Signed)
 Physician Discharge Summary  [name] FMW:[id number] DOB: [birth year] DOA: 12/01/2023  PCP: Maree Isles, MD  Admit date: 12/01/2023 Discharge date: 12/12/2023  Admitted From: Home  Discharge disposition: Home with outpatient PT   Recommendations for Outpatient Follow-Up:   Follow up with your primary care provider in one week.  Check CBC, BMP, magnesium in the next visit Follow-up with [facility] as outpatient, Follow-up with neurosurgery as outpatient. Encouraged patient to wear his stockings and take time to change position/orthostatic precautions.   Discharge Diagnosis:   Principal Problem:   Subdural hematoma (HCC) Active Problems:   Headache   Discharge Condition: Improved.  Diet recommendation:   Regular.  Wound care: None.  Code status: Full.   History of Present Illness:   58 y.o. male with past medical history significant for HTN, chronic back pain, GERD, anxiety, h/o left nephrectomy, recurrent syncope, falls secondary to dysautonomia and orthostatic hypotension presented to the hospital initially at EMS after unwitnessed fall covered in blood after having a drink at the neighbors house. EMS noted right forehead scalp laceration, hematoma, he was put on a c-collar and was brought into the hospital. In the ED, patient was hypothermic at 94 degrees, was noted to have approximately 5 to 6 mm laceration to left forehead that required sutures. CT head scan showed acute bilateral convexity subdural hematoma left more than the right with associated interhemispheric/ para falcine hemorrhage and possible trace 1-2 mm rightward shift. Small subarachnoid hemorrhage at the right frontal vertex. Globular extra-axial collections at the right vertex, possibly focal subdural or subarachnoid blood, measuring up to 8 mm, Moderate right forehead scalp laceration and hematoma. CT cervical spine showed questionable nondisplaced C4 spinous process fracture and  CT scan of the maxillofacial area showed mildly depressed nasal bone fracture. CT scan of the chest abdomen and pelvis without any findings of trauma. Initially patient was seen by trauma surgery and neurosurgery was also consulted.   Hospital Course:   Following conditions were addressed during hospitalization as listed below,  Traumatic raumatic head, neck, facial injury -Bilateral subdural hematoma (L > R ) -Subarachnoid hemorrhage -Nondisplaced C4 spinous process fracture -Right forehead scalp laceration and hematoma Intractable headache Secondary to falls.  UDS positive for benzos.  Trauma surgery and neurosurgery were consulted during hospitalization.  CT head stable for SDH and subarachnoid hemorrhage.  Dr. Lenox neurosurgery recommended conservative treatment.  Since patient continued to have headache was on dexamethasone, oxycodone  briefly on Dilaudid  Robaxin Fioricet.  Subsequently sumatriptan and Reglan helped his pain indicating migrainous attack.  Sumatriptan will be prescribed as needed on discharge due to intractable migraine.    Intractable headache.  Thought to be secondary to migraine.  Encouraged to follow-up with neurology as outpatient.  Sumatriptan and Reglan and naproxen  daily as needed short-term prescribed due to severe migraine during hospitalization and relief only with these medications.   History of recurrent syncope, fall Secondary to dysautonomia and orthostatic hypotension which is chronic.  Continue compression stockings at home.  Orthostatic precautions were explained.  PT has seen the patient and recommend outpatient PT on discharge.   Hypertension with orthostatic hypotension Patient was on amlodipine 5 mg at home.  Has been reduced to 2.5 mg on discharge.   Nocturnal hypoxemia Nocturnal oxygen will be prescribed on discharge.  TOC has already arranged this.    H/o diverticulosis S/p colectomy, colostomy reversal Continue bowel regimen   Right chest  wall pain Rib x-ray was negative for rib fracture.  Alcohol intoxication Initial alcohol level was 285.  Occasionally drinks.  Discouraged drinking.   Hyponatremia Mild.  Latest sodium prior to discharge was 133.   Chronic back pain  anxiety/depression Patient was on BuSpar fluoxetine Seroquel and Xanax and Neurontin as outpatient.  Will be resumed on discharge. COPD Continue bronchodilators   Left knee pain Improved   h/o left nephrectomy Traumatic at the age of 67  Disposition.  At this time, patient is stable for disposition home with outpatient PCP, neurology and neurosurgery follow-up.  Spoke with the patient's spouse at length prior to discharge regarding precautions, medications, need for follow-up with neurology and neurosurgery.  Medical Consultants:    Neurosurgery  Procedures:    None Subjective:   Today, patient was seen and examined at bedside.  Had headache in the morning but after migraine cocktail significantly improved.  Responded well to sumatriptan.  Feels better at this time.  Discharge Exam:   Vitals:   12/12/23 0736 12/12/23 1120  BP: (!) 145/95 (!) 146/88  Pulse: (!) 56 85  Resp:  18  Temp: 98.4 F (36.9 C) 97.7 F (36.5 C)  SpO2: 94% 94%   Vitals:   12/11/23 2330 12/12/23 0346 12/12/23 0736 12/12/23 1120  BP: (!) 139/94 (!) 149/75 (!) 145/95 (!) 146/88  Pulse: 78 65 (!) 56 85  Resp: 16 18  18   Temp: 98.7 F (37.1 C) 97.7 F (36.5 C) 98.4 F (36.9 C) 97.7 F (36.5 C)  TempSrc: Oral Oral Oral Oral  SpO2: 92% 94% 94% 94%  Weight:      Height:       Body mass index is 29.86 kg/m.  General: Alert awake, not in obvious distress HENT: pupils equally reacting to light,  No scleral pallor or icterus noted. Oral mucosa is moist.  Chest:  Clear breath sounds.  No crackles or wheezes.  CVS: S1 &S2 heard. No murmur.  Regular rate and rhythm. Abdomen: Soft, nontender, nondistended.  Bowel sounds are heard.   Extremities: No cyanosis,  clubbing or edema.  Peripheral pulses are palpable. Psych: Alert, awake and oriented, normal mood CNS:  No cranial nerve deficits.  Power equal in all extremities.   Skin: Warm and dry.  No rashes noted.  The results of significant diagnostics from this hospitalization (including imaging, microbiology, ancillary and laboratory) are listed below for reference.     Diagnostic Studies:   ECHOCARDIOGRAM COMPLETE Result Date: 12/02/2023    ECHOCARDIOGRAM REPORT   Patient Name:   NICOLE HAFLEY [facility] Date of Exam: 12/02/2023 Medical Rec #:  [id number]         Height:       74.0 in Accession #:    [id number]        Weight:       237.0 lb Date of Birth:  20-Apr-1965         BSA:          2.336 m Patient Age:    58 years          BP:           110/69 mmHg Patient Gender: M                 HR:           67 bpm. Exam Location:  Inpatient Procedure: 2D Echo, Color Doppler and Cardiac Doppler (Both Spectral and Color            Flow Doppler were utilized during procedure). Indications:  R55 Syncope  History:        Patient has prior history of Echocardiogram examinations, most                 recent 08/31/2023. Risk Factors:Hypertension.  Sonographer:    Damien Senior RDCS Referring Phys: [id number] OLADAPO ADEFESO IMPRESSIONS  1. Left ventricular ejection fraction, by estimation, is 65 to 70%. The left ventricle has normal function. The left ventricle has no regional wall motion abnormalities. There is mild asymmetric left ventricular hypertrophy of the septal segment. Left ventricular diastolic parameters are consistent with Grade I diastolic dysfunction (impaired relaxation).  2. Right ventricular systolic function is normal. The right ventricular size is normal. Tricuspid regurgitation signal is inadequate for assessing PA pressure.  3. The mitral valve is normal in structure. Trivial mitral valve regurgitation. No evidence of mitral stenosis.  4. The aortic valve is tricuspid. Aortic valve regurgitation is not visualized.  No aortic stenosis is present.  5. The inferior vena cava is dilated in size with >50% respiratory variability, suggesting right atrial pressure of 8 mmHg. FINDINGS  Left Ventricle: Left ventricular ejection fraction, by estimation, is 65 to 70%. The left ventricle has normal function. The left ventricle has no regional wall motion abnormalities. The left ventricular internal cavity size was normal in size. There is  mild asymmetric left ventricular hypertrophy of the septal segment. Left ventricular diastolic parameters are consistent with Grade I diastolic dysfunction (impaired relaxation). Normal left ventricular filling pressure. Right Ventricle: The right ventricular size is normal. No increase in right ventricular wall thickness. Right ventricular systolic function is normal. Tricuspid regurgitation signal is inadequate for assessing PA pressure. The tricuspid regurgitant velocity is 1.77 m/s, and with an assumed right atrial pressure of 8 mmHg, the estimated right ventricular systolic pressure is 20.5 mmHg. Left Atrium: Left atrial size was normal in size. Right Atrium: Right atrial size was normal in size. Pericardium: There is no evidence of pericardial effusion. Mitral Valve: The mitral valve is normal in structure. Trivial mitral valve regurgitation. No evidence of mitral valve stenosis. Tricuspid Valve: The tricuspid valve is normal in structure. Tricuspid valve regurgitation is trivial. No evidence of tricuspid stenosis. Aortic Valve: The aortic valve is tricuspid. Aortic valve regurgitation is not visualized. No aortic stenosis is present. Pulmonic Valve: The pulmonic valve was grossly normal. Pulmonic valve regurgitation is trivial. No evidence of pulmonic stenosis. Aorta: The aortic root and ascending aorta are structurally normal, with no evidence of dilitation. Venous: The inferior vena cava is dilated in size with greater than 50% respiratory variability, suggesting right atrial pressure of 8  mmHg. IAS/Shunts: No atrial level shunt detected by color flow Doppler.  LEFT VENTRICLE PLAX 2D LVIDd:         4.40 cm   Diastology LVIDs:         2.70 cm   LV e' medial:   9.46 cm/s LV PW:         0.90 cm   LV E/e' medial: 8.8 LV IVS:        1.30 cm LVOT diam:     2.10 cm LV SV:         85 LV SV Index:   36 LVOT Area:     3.46 cm  RIGHT VENTRICLE RV S prime:     15.00 cm/s  PULMONARY VEINS TAPSE (M-mode): 2.5 cm      Diastolic Velocity: 42.90 cm/s  S/D Velocity:       1.30                             Systolic Velocity:  57.90 cm/s LEFT ATRIUM             Index        RIGHT ATRIUM           Index LA diam:        3.70 cm 1.58 cm/m   RA Area:     18.70 cm LA Vol (A2C):   46.5 ml 19.91 ml/m  RA Volume:   55.60 ml  23.80 ml/m LA Vol (A4C):   38.6 ml 16.52 ml/m LA Biplane Vol: 46.7 ml 19.99 ml/m  AORTIC VALVE LVOT Vmax:   123.00 cm/s LVOT Vmean:  81.600 cm/s LVOT VTI:    0.246 m  AORTA Ao Root diam: 2.70 cm Ao Asc diam:  3.20 cm MITRAL VALVE               TRICUSPID VALVE MV Area (PHT): 3.89 cm    TR Peak grad:   12.5 mmHg MV Decel Time: 195 msec    TR Vmax:        177.00 cm/s MV E velocity: 82.80 cm/s MV A velocity: 86.50 cm/s  SHUNTS MV E/A ratio:  0.96        Systemic VTI:  0.25 m                            Systemic Diam: 2.10 cm Annabella Scarce MD Electronically signed by Annabella Scarce MD Signature Date/Time: 12/02/2023/4:26:30 PM    Final    CT HEAD WO CONTRAST ( ) Result Date: 12/02/2023 CLINICAL DATA:  Follow-up subdural hematoma from recent fall. EXAM: CT HEAD WITHOUT CONTRAST TECHNIQUE: Contiguous axial images were obtained from the base of the skull through the vertex without intravenous contrast. RADIATION DOSE REDUCTION: This exam was performed according to the departmental dose-optimization program which includes automated exposure control, adjustment of the mA and/or kV according to patient size and/or use of iterative reconstruction technique. COMPARISON:   12/01/2023 FINDINGS: Brain: Examination demonstrates evidence of patient's left convexity acute subdural hematoma measuring approximately 6 mm in thickness (previously 9-10 mm). Stable parafalcine/interhemispheric acute subdural hematoma. Stable 4 mm right frontal subdural hematoma. Stable globular foci of probable acute subarachnoid hemorrhage over the right parafalcine area of the high frontoparietal region. No new or worsening areas of hemorrhage. Ventricles and cisterns are normal. No significant mass effect or midline shift. Vascular: No hyperdense vessel or unexpected calcification. Skull: Normal. Negative for fracture or focal lesion. Sinuses/Orbits: Orbits are normal. Sinuses predominantly clear and unchanged. Other: Mild right frontal scalp contusion stable. IMPRESSION: 1. Slight interval decrease in size of patient's left convexity acute subdural hematoma measuring 6 mm in thickness (previously 9-10 mm). Stable parafalcine/interhemispheric acute subdural hematoma. Stable 4 mm right frontal subdural hematoma. Stable globular foci of probable acute subarachnoid hemorrhage over the right parafalcine area of the high frontoparietal region. No new or worsening areas of hemorrhage. No significant mass effect or midline shift. 2. Mild right frontal scalp contusion stable. Electronically Signed   By: Toribio Agreste M.D.   On: 12/02/2023 12:52   DG Knee Left Port Result Date: 12/02/2023 EXAM: 1 OR 2 VIEW(S) XRAY OF THE LEFT KNEE 12/02/2023 11:48:26 AM COMPARISON: None available. CLINICAL HISTORY: Pain. FINDINGS: BONES AND JOINTS: No  acute fracture. No focal osseous lesion. No joint dislocation. No significant joint effusion. No significant degenerative changes. Small metallic density noted adjacent to lateral aspect of patella concerning for foreign body. SOFT TISSUES: The soft tissues are unremarkable. IMPRESSION: 1. Possible Metallic foreign body adjacent to the lateral patella. Electronically signed by: Lynwood Seip MD 12/02/2023 12:21 PM EST RP Workstation: [id number]   DG Shoulder Left Port Result Date: 12/02/2023 EXAM: 1 VIEW XRAY OF THE LEFT SHOULDER 12/02/2023 10:48:00 AM COMPARISON: None available. CLINICAL HISTORY: [id number] Pain [id number] Pain FINDINGS: BONES AND JOINTS: Glenohumeral joint is normally aligned. No acute fracture or dislocation. The [facility] joint is unremarkable in appearance. SOFT TISSUES: No abnormal calcifications. Visualized lung is unremarkable. IMPRESSION: 1. No significant abnormality. Electronically signed by: Lynwood Seip MD 12/02/2023 11:21 AM EST RP Workstation: [id number]   CT Cervical Spine Wo Contrast Result Date: 12/02/2023 EXAM: CT CERVICAL SPINE WITHOUT CONTRAST 12/01/2023 11:28:41 PM TECHNIQUE: CT of the cervical spine was performed without the administration of intravenous contrast. Multiplanar reformatted images are provided for review. Automated exposure control, iterative reconstruction, and/or weight based adjustment of the mA/kV was utilized to reduce the radiation dose to as low as reasonably achievable. COMPARISON: None available. CLINICAL HISTORY: Facial trauma, blunt. FINDINGS: CERVICAL SPINE: BONES AND ALIGNMENT: Mild reversal of cervical lordosis. Trace anterior listhesis C4 on C5. Trace anterior listhesis C5 on C6. Facet alignment is within normal limits. Questionable nondisplaced fracture involving the spinous process of C4, series 8 image 46. Vertebral body heights are maintained. DEGENERATIVE CHANGES: Moderate disk space narrowing C5-C6 and C6-C7. Multilevel hypertrophic facet degenerative changes. No high-grade bony canal stenosis. SOFT TISSUES: No prevertebral soft tissue swelling. IMPRESSION: 1. Questionable nondisplaced C4 spinous process fracture; correlate with point tenderness 2. Mild reversal of cervical lordosis. 3. Trace anterolisthesis at C4 on C5 and C5 on C6. 4. Moderate degenerative changes  at C5-C6 and C6-C7. Electronically signed by: Luke Bun MD  12/02/2023 12:16 AM EST RP Workstation: [id number]   CT Maxillofacial Wo Contrast Result Date: 12/02/2023 EXAM: CT OF THE FACE WITHOUT CONTRAST 12/01/2023 11:28:41 PM TECHNIQUE: CT of the face was performed without the administration of intravenous contrast. Multiplanar reformatted images are provided for review. Automated exposure control, iterative reconstruction, and/or weight based adjustment of the mA/kV was utilized to reduce the radiation dose to as low as reasonably achievable. COMPARISON: None available. CLINICAL HISTORY: Facial trauma, blunt. FINDINGS: FACIAL BONES: Acute mildly depressed nasal bone fracture. No definitive sinus wall fracture. No mandibular dislocation. No suspicious bone lesion. ORBITS: Globes are intact. No acute traumatic injury. No inflammatory change. SINUSES AND MASTOIDS: Mild mucosal thickening in the sinuses. Small fluid level in the right maxillary sinus with chronic-appearing wall thickening. SOFT TISSUES: Moderate right forehead laceration and hematoma. Moderate-to-marked right periorbital hematoma. Soft tissue swelling over the nasal area. IMPRESSION: 1. Acute mildly depressed nasal bone fracture. 2. Moderate right forehead laceration and hematoma with moderate-to-marked right periorbital hematoma . 3. Right maxillary sinus fluid level with chronic-appearing wall thickening, without definitive sinus wall fracture. Electronically signed by: Luke Bun MD 12/02/2023 12:12 AM EST RP Workstation: [id number]   CT Head Wo Contrast Result Date: 12/02/2023 EXAM: CT HEAD WITHOUT CONTRAST 12/01/2023 11:28:41 PM TECHNIQUE: CT of the head was performed without the administration of intravenous contrast. Automated exposure control, iterative reconstruction, and/or weight based adjustment of the mA/kV was utilized to reduce the radiation dose to as low as reasonably achievable. COMPARISON: None available. CLINICAL HISTORY: unwitnessed fall, head laceration FINDINGS: BRAIN AND  VENTRICLES:  The ventricles are nonenlarged. No evidence of acute infarct. Acute bilateral left greater than right convexity subdural hematomas with hemorrhage along the right greater than left falx. Maximum thickness of left subdural hematoma measures 9 mm. Right convexity subdural hematoma measures about 4 mm maximum thickness. Globular extra-axial hemorrhage at the right vertex, possibly focal subdural or subarachnoid, measuring 8 mm maximum thickness. Small foci of subarachnoid hemorrhage at the right frontal vertex. Possible trace 1 to 2 mm shift to the right. ORBITS: No acute abnormality. SINUSES: Moderate mucosal thickening in the sinuses. SOFT TISSUES AND SKULL: Moderate right forehead scalp laceration and hematoma. No skull fracture. Traumatic Brain Injury Risk Stratification ----- Skull Fracture: No (Low - mBIG 1) Subdural Hematoma (SDH): Left convexity: 9 mm (High - mBIG 3). Right convexity: 4 mm (mBIG 1). Globular extra-axial hemorrhage at right vertex (if subdural): 8 mm (High - mBIG 3). Overall: High - mBIG 3. Subarachnoid Hemorrhage [facility]): Small foci at right frontal vertex (Trace - up to 2 sulci, Low - mBIG 1). Globular extra-axial hemorrhage at right vertex (if subarachnoid): 8 mm (localized, unilateral, Moderate - mBIG 2). Overall: Moderate - mBIG 2. Epidural Hematoma (EDH): No (Low - mBIG 1) Cerebral contusion, intra-axial, intraparenchymal Hemorrhage (IPH): No (Low - mBIG 1) Intraventricular Hemorrhage (IVH): No (Low - mBIG 1) Midline Shift > 1mm or Edema/effacement of sulci/vents: Possible trace 1 to 2 mm shift to the right (High - mBIG 3) IMPRESSION: 1. Acute bilateral convexity subdural hematomas, left greater than right, with associated interhemispheric/ para falcine hemorrhage and possible trace 1-2 mm rightward shift. 2. Small subarachnoid hemorrhage at the right frontal vertex. 3. Globular extra-axial collections at the right vertex, possibly focal subdural or subarachnoid blood, measuring  up to 8 mm. 4. Moderate right forehead scalp laceration and hematoma. Critical finding(s) were communicated by phone by Dr. Scott to  Dr. Theadore on 12/02/2023  at  11:58 PM. Electronically signed by: Luke Scott MD 12/02/2023 12:08 AM EST RP Workstation: [id number]   DG Pelvis Portable Result Date: 12/01/2023 EXAM: 1 or 2 VIEW(S) XRAY OF THE PELVIS 12/01/2023 11:45:00 PM COMPARISON: None available. CLINICAL HISTORY: Trauma Trauma FINDINGS: BONES AND JOINTS: No acute fracture. No focal osseous lesion. No joint dislocation. SOFT TISSUES: The soft tissues are unremarkable. IMPRESSION: 1. No significant abnormality. Electronically signed by: Greig Pique MD 12/01/2023 11:56 PM EST RP Workstation: [id number]   DG Chest Port 1 View Result Date: 12/01/2023 EXAM: 1 VIEW(S) XRAY OF THE CHEST 12/01/2023 11:45:00 PM COMPARISON: Chest x-ray dated 08/18/2004. CLINICAL HISTORY: Trauma. FINDINGS: LUNGS AND PLEURA: No focal pulmonary opacity. No pulmonary edema. No pleural effusion. No pneumothorax. HEART AND MEDIASTINUM: No acute abnormality of the cardiac and mediastinal silhouettes. BONES AND SOFT TISSUES: No acute osseous abnormality. IMPRESSION: 1. No acute process. Electronically signed by: Greig Pique MD 12/01/2023 11:55 PM EST RP Workstation: [id number]   CT CHEST ABDOMEN PELVIS WO CONTRAST Result Date: 12/01/2023 EXAM: CT CHEST, ABDOMEN AND PELVIS WITHOUT CONTRAST 12/01/2023 11:28:41 PM TECHNIQUE: CT of the chest, abdomen and pelvis was performed without the administration of intravenous contrast. Multiplanar reformatted images are provided for review. Automated exposure control, iterative reconstruction, and/or weight based adjustment of the mA/kV was utilized to reduce the radiation dose to as low as reasonably achievable. COMPARISON: CT chest abdomen and pelvis 12/27/2012. CLINICAL HISTORY: Polytrauma, blunt. FINDINGS: CHEST: MEDIASTINUM AND LYMPH NODES: Heart and pericardium are unremarkable. Fair  atherosclerotic calcifications of the coronary arteries. There is diffuse esophageal wall thickening with mild surrounding inflammatory. There are  small paraesophageal lymph nodes. There is a mildly hyperdense fluid in the posterior right mediastinum measuring 1.4 x 1.2 x 2.8 cm. The central airways are clear. No mediastinal, hilar or axillary lymphadenopathy. LUNGS AND PLEURA: Mild emphysema present. Minimal patchy ground glass opacities in the left upper lobe. Lung base nodules in the lower left hemithorax measuring up to 2.5 cm appear unchanged. This was previously characterized as splenosis. No focal consolidation or pulmonary edema. No pleural effusion or pneumothorax. ABDOMEN AND PELVIS: LIVER: The liver is unremarkable. GALLBLADDER AND BILE DUCTS: Gallbladder is unremarkable. No biliary ductal dilatation. SPLEEN: The spleen had been surgically removed. PANCREAS: No acute abnormality. ADRENAL GLANDS: No acute abnormality. KIDNEYS, URETERS AND BLADDER: The left kidney had been surgically removed. No stones in the right kidney or ureters. No hydronephrosis of the right kidney. No perinephric or periureteral stranding. Urinary bladder is unremarkable. GI AND BOWEL: Stomach demonstrates no acute abnormality. Appendix is not visualized. There is no bowel obstruction. REPRODUCTIVE ORGANS: No acute abnormality. PERITONEUM AND RETROPERITONEUM: No ascites. No free air. VASCULATURE: Aorta is normal in caliber. Atherosclerotic calcifications of the aorta. ABDOMINAL AND PELVIS LYMPH NODES: No lymphadenopathy. BONES AND SOFT TISSUES: Chronic rib fractures bilaterally. No acute fractures are identified. No focal soft tissue abnormality. IMPRESSION: 1. Diffuse esophageal wall thickening with mild surrounding inflammatory changes and small paraesophageal lymph nodes. Correlate clinically for esophagitis. GI consultation follow-up recommended. 2. Mildly hyperdense fluid in the posterior right mediastinum measuring 1.4 x 1.2 x  2.8 cm. This is indeterminate and may be related to fluid collection secondary to trauma or secondary to esophagitis. Contained esophageal perforation less likely, but cannot be excluded given location and appearance. Electronically signed by: Greig Pique MD 12/01/2023 11:41 PM EST RP Workstation: [id number]     Labs:   Basic Metabolic Panel: Recent Labs  Lab 12/06/23 0105 12/07/23 0228 12/09/23 0123 12/10/23 0428 12/11/23 0434  NA 132* 130* 133* 133* 133*  K 4.0 3.8 3.9 4.3 4.2  CL 96* 98 99 99 100  CO2 26 25 24 24 25   GLUCOSE 115* 117* 134* 116* 114*  BUN 10 8 13 16 16   CREATININE 0.95 0.78 0.93 0.92 0.86  CALCIUM 8.5* 8.6* 9.2 9.2 9.0  MG  --   --   --  2.1  --   PHOS  --   --   --  3.8  --    GFR Estimated Creatinine Clearance: 124.1 mL/min (by C-G formula based on SCr of 0.86 mg/dL). Liver Function Tests: Recent Labs  Lab 12/06/23 0105 12/07/23 0228 12/09/23 0123  AST 20 23 23   ALT 24 25 25   ALKPHOS 71 65 68  BILITOT 0.9 0.9 0.7  PROT 6.2* 6.0* 6.6  ALBUMIN 2.8* 2.8* 3.0*   No results for input(s): LIPASE, AMYLASE in the last 168 hours. No results for input(s): AMMONIA in the last 168 hours. Coagulation profile No results for input(s): INR, PROTIME in the last 168 hours.  CBC: Recent Labs  Lab 12/06/23 0105 12/07/23 0228 12/09/23 0123 12/10/23 0428 12/11/23 0434  WBC 16.7* 16.0* 17.2* 20.5* 18.7*  NEUTROABS 10.9* 11.1*  --   --   --   HGB 14.1 13.8 14.4 14.6 14.6  HCT 40.9 40.1 41.5 41.3 42.5  MCV 100.7* 100.0 98.3 97.6 99.5  PLT 265 282 329 360 397   Cardiac Enzymes: No results for input(s): CKTOTAL, CKMB, CKMBINDEX, TROPONINI in the last 168 hours. BNP: Invalid input(s): POCBNP CBG: No results for input(s): GLUCAP in the last  168 hours. D-Dimer No results for input(s): DDIMER in the last 72 hours. Hgb A1c No results for input(s): HGBA1C in the last 72 hours. Lipid Profile No results for input(s): CHOL, HDL,  LDLCALC, TRIG, CHOLHDL, LDLDIRECT in the last 72 hours. Thyroid  function studies No results for input(s): TSH, T4TOTAL, T3FREE, THYROIDAB in the last 72 hours.  Invalid input(s): FREET3 Anemia work up No results for input(s): VITAMINB12, FOLATE, FERRITIN, TIBC, IRON, RETICCTPCT in the last 72 hours. Microbiology Recent Results (from the past 240 hours)  Culture, blood (Routine X 2) w Reflex to ID Panel     Status: None   Collection Time: 12/05/23 11:03 AM   Specimen: BLOOD RIGHT ARM  Result Value Ref Range Status   Specimen Description BLOOD RIGHT ARM  Final   Special Requests   Final    BOTTLES DRAWN AEROBIC AND ANAEROBIC Blood Culture adequate volume   Culture   Final    NO GROWTH 5 DAYS Performed at [facility], [address] [place], [place] [address]    Report Status 12/10/2023 FINAL  Final  Culture, blood (Routine X 2) w Reflex to ID Panel     Status: None   Collection Time: 12/05/23 11:03 AM   Specimen: BLOOD RIGHT HAND  Result Value Ref Range Status   Specimen Description BLOOD RIGHT HAND  Final   Special Requests   Final    BOTTLES DRAWN AEROBIC AND ANAEROBIC Blood Culture results may not be optimal due to an inadequate volume of blood received in culture bottles   Culture   Final    NO GROWTH 5 DAYS Performed at [facility], [address], [place], [place] [address]    Report Status 12/10/2023 FINAL  Final  MRSA Next Gen by PCR, Nasal     Status: None   Collection Time: 12/05/23 12:37 PM  Result Value Ref Range Status   MRSA by PCR Next Gen NOT DETECTED NOT DETECTED Final    Comment: (NOTE) The GeneXpert MRSA Assay (FDA approved for NASAL specimens only), is one component of a comprehensive MRSA colonization surveillance program. It is not intended to diagnose MRSA infection nor to guide or monitor treatment for MRSA infections. Test performance is not FDA approved in patients less than 24 years old. Performed at  [facility], [address] [place], [place] [address]      Discharge Instructions:   Discharge Instructions     Ambulatory referral to Neurology   Complete by: As directed    An appointment is requested in approximately: 4 weeks   Ambulatory referral to Occupational Therapy   Complete by: As directed    Ambulatory referral to Physical Therapy   Complete by: As directed    Ambulatory referral to Physical Therapy   Complete by: As directed    Ambulatory referral to Speech Therapy   Complete by: As directed    Ambulatory referral to Speech Therapy   Complete by: As directed    Call MD for:  persistant dizziness or light-headedness   Complete by: As directed    Call MD for:  persistant nausea and vomiting   Complete by: As directed    Call MD for:  severe uncontrolled pain   Complete by: As directed    Diet general   Complete by: As directed    Discharge instructions   Complete by: As directed    Follow-up with your primary care provider in 1 week.  Ambulatory referral  has been made for neurology, follow up when scheduled by the clinic.  Follow-up with neurosurgery Dr Colon as outpatient in 2 to 3 weeks (call to schedule)  Take medications as prescribed.  Take time to change positions especially from sitting to standing position.  Continue to wear elastic stockings.  Continue hydration at home.  Seek medical attention for worsening symptoms.   Increase activity slowly   Complete by: As directed    No wound care   Complete by: As directed       Allergies as of 12/12/2023       Reactions   Penicillins Rash   Hydrocodone  Itching        Medication List     STOP taking these medications    HYDROcodone -acetaminophen  5-325 MG tablet Commonly known as: NORCO/VICODIN   levocetirizine 5 MG tablet Commonly known as: XYZAL   lisinopril 20 MG tablet Commonly known as: ZESTRIL       TAKE these medications    ALPRAZolam 1 MG tablet Commonly known as:  XANAX Take 1 mg by mouth daily as needed for anxiety.   amLODipine 2.5 MG tablet Commonly known as: NORVASC Take 1 tablet (2.5 mg total) by mouth daily. What changed:  medication strength how much to take when to take this   B COMPLEX PO Take 1 tablet by mouth daily.   BIOTIN PO Take 1 tablet by mouth daily.   busPIRone 15 MG tablet Commonly known as: BUSPAR Take 10-15 mg by mouth See admin instructions. Take 10mg  by mouth in the morning and 15mg  in the afternoon.   butalbital-acetaminophen -caffeine 50-325-40 MG tablet Commonly known as: FIORICET Take 1 tablet by mouth every 6 (six) hours as needed for headache.   feeding supplement Liqd Take 237 mLs by mouth 3 (three) times daily between meals for 15 days.   FLUoxetine HCl 60 MG Tabs Take 60 mg by mouth daily.   fluticasone 50 MCG/ACT nasal spray Commonly known as: FLONASE Place 1 spray into both nostrils daily as needed for allergies or rhinitis.   gabapentin 100 MG capsule Commonly known as: NEURONTIN Take 100-300 mg by mouth at bedtime as needed (Pain).   methocarbamol 750 MG tablet Commonly known as: ROBAXIN Take 1 tablet (750 mg total) by mouth every 8 (eight) hours as needed for up to 5 days for muscle spasms.   metoCLOPramide 5 MG tablet Commonly known as: Reglan Take 1 tablet (5 mg total) by mouth daily as needed for nausea or vomiting (with migraine).   naproxen  500 MG tablet Commonly known as: Naprosyn  Take 1 tablet (500 mg total) by mouth daily as needed for headache (with migraine).   Nicotine 21-14-7 MG/24HR Kit Apply 1 kit topically daily.   omeprazole 20 MG capsule Commonly known as: PRILOSEC Take 20 mg by mouth daily as needed (Indigestion).   ondansetron  4 MG tablet Commonly known as: ZOFRAN  Take 1 tablet (4 mg total) by mouth every 6 (six) hours as needed for nausea.   oxyCODONE  5 MG immediate release tablet Commonly known as: Oxy IR/ROXICODONE  Take 1 tablet (5 mg total) by mouth  every 6 (six) hours as needed for moderate pain (pain score 4-6) or severe pain (pain score 7-10) (5mg  for moderate pain, 10mg  for severe pain).   polyethylene glycol powder 17 GM/SCOOP powder Commonly known as: GLYCOLAX/MIRALAX Take 17 g by mouth daily as needed for moderate constipation. Dissolve 1 capful (17g) in 4-8 ounces of liquid and take by mouth daily as needed for  moderate constipation   QUEtiapine 50 MG tablet Commonly known as: SEROQUEL Take 50 mg by mouth at bedtime.   sildenafil 20 MG tablet Commonly known as: REVATIO Take 20 mg by mouth daily as needed (ED).   SUMAtriptan 50 MG tablet Commonly known as: Imitrex Take 1 tablet (50 mg total) by mouth once as needed for migraine. May repeat in 2 hours if headache persists or recurs. Max: 200mg  per 24 hours   Symbicort 160-4.5 MCG/ACT inhaler Generic drug: budesonide-formoterol Inhale 2 puffs into the lungs 2 (two) times daily.   Ventolin HFA 108 (90 Base) MCG/ACT inhaler Generic drug: albuterol Inhale 2 puffs into the lungs 2 (two) times daily.               Durable Medical Equipment  (From admission, onward)           Start     Ordered   12/10/23 0942  For home use only DME oxygen  Once       Question Answer Comment  Length of Need Lifetime   Mode or (Route) Nasal cannula   Liters per Minute 2   Frequency Only at night (stationary unit needed)   Oxygen conserving device Yes   Oxygen delivery system: Gas      12/10/23 0942   12/04/23 1216  For home use only DME Walker rolling  Once       Question Answer Comment  Walker: With 5 Inch Wheels   Patient needs a walker to treat with the following condition Weakness      12/04/23 1215            Follow-up Information     [facility] Follow up.   Why: Transportation services under your Medicaid. Call and schedule 2-3 days in advance. Contact information: [phone]        [employer] (DME) Follow up.   Specialty: DME Services Why:  Company supplying Technical Sales Engineer information: [address] [employer] [place]  [address] [phone]        [facility]. Schedule an appointment as soon as possible for a visit.   Why: for outpatient therapy Contact information: [address] [place] [address] [phone]         Maree Isles, MD Follow up in 1 week(s).   Specialty: Internal Medicine Contact information: [address] [place] [address] [phone]         Colon Shove, MD Follow up in 3 week(s).   Specialty: Neurosurgery Contact information: [address] [place] [address] [phone]                  Time coordinating discharge: 39 minutes  Signed:  Azaylea Maves  [facility] 12/12/2023, 4:46 PM

## 2023-12-12 NOTE — Progress Notes (Signed)
 Sutures removed from above right eye brow area; scab intact over laceration. No drainage. Site is dry.

## 2023-12-12 NOTE — Plan of Care (Signed)
 PRN cocktail in use for headache. Sutures removed.   Problem: Education: Goal: Knowledge of General Education information will improve Description: Including pain rating scale, medication(s)/side effects and non-pharmacologic comfort measures Outcome: Progressing   Problem: Activity: Goal: Risk for activity intolerance will decrease Outcome: Progressing   Problem: Nutrition: Goal: Adequate nutrition will be maintained Outcome: Progressing   Problem: Safety: Goal: Ability to remain free from injury will improve Outcome: Progressing   Problem: Pain Managment: Goal: General experience of comfort will improve and/or be controlled Outcome: Progressing   Problem: Skin Integrity: Goal: Risk for impaired skin integrity will decrease Outcome: Progressing

## 2023-12-17 ENCOUNTER — Ambulatory Visit: Attending: Nurse Practitioner | Admitting: Nurse Practitioner

## 2023-12-17 NOTE — Progress Notes (Deleted)
 Cardiology Office Note   Date:  12/17/2023  ID:  [name], DOB Aug 17, 1965, MRN [id number] PCP: Maree Isles, MD  [facility] Providers Cardiologist:  Alvan Carrier, MD     History of Present Illness [name] is a 58 y.o. male with a PMH of syncope, chronic orthostatic dizziness, hypertension, diverticulitis, history of left nephrectomy, and anxiety, who presents today for 39-month follow-up.  Last seen by Dr. Alvan on September 11, 2023.  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  Low BPs at evaluation.  It was noted from echo August 2025 with PCP showed LVEF 59%, grade 1 DD.  Patient noted limited oral hydration, BP meds were cut back by PCP.  Dr. Alvan recommended follow symptoms to increase hydration and with cutting back on BP meds and to except high BPs for now.  Today presents for 69-month follow-up appointment.  He states he notices labile BP readings from 190/133 in AM to evening 140 systolic and dropping as low as 80/40 around once per week.  Does admit to anxiety and has recently started taking BuSpar , says he notices his BP starts to improve after taking this medication.  Also admits to not eating meals at times, says he does not feel hungry.  PCP has been adjusting his medication to help with his blood pressure.Does admit to waking up frequently during the night, having what sounds like panic attacks at times at night.  Has never been tested for sleep apnea in the past. 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 Denies any chest pain, shortness of breath, syncope, orthopnea, PND, swelling or significant weight changes, acute bleeding, or claudication. Denies any recent syncope and no longer on propranolol as a recent heart monitor showed pauses at night - see below.  He  tells me he is seeing a psychiatrist for his anxiety, [facility].  ROS: Negative. See HPI.   SH: Does wear a nicotine patch and occasionally smokes cigarettes while wearing this.  Drinks about 2-3 beers per week.  Denies any illicit drug use.  Studies Reviewed  EKG: EKG is not ordered today.   ZIO monitor 09/2023: Preliminary findings Patient had min heart rate of 32 bpm, max heart rate of 136 bpm, and average heart rate 67 bpm.  Predominant underlying rhythm was sinus rhythm.  Slight P wave morphology changes were noted.  1 run of SVT lasting 4 beats, max rate 136 bpm, 119 pauses occurred with the longest lasting 5.2 seconds.  Isolated SVE's were rare, along with rare SVE couplets and SVE triplets were rare.  Isolated VE's were rare along with VE couplets and no VE triplets were present.  Ventricular trigeminy was present.  Echo see full report scanned under CV Proc  Risk Assessment/Calculations  No BP recorded.  {Refresh Note OR Click here to enter BP  :1}***       Physical Exam VS:  There were no vitals taken for this visit.       Wt Readings from Last 3 Encounters:  12/10/23 232 lb 9.4 oz (105.5 kg)  11/05/23 237 lb (107.5 kg)  09/30/23 238 lb 15.7 oz (108.4 kg)    GEN: Well nourished, well developed in no acute distress, appears anxious/nervous NECK: No JVD; No carotid bruits CARDIAC: S1/S2, RRR, no murmurs, rubs, gallops RESPIRATORY:  Clear to auscultation without rales, wheezing or rhonchi  ABDOMEN: Soft, non-tender, non-distended EXTREMITIES:  No edema; No deformity   ASSESSMENT AND PLAN  Orthostatic syncope, HTN, tachycardia Self-reported history of orthostatic syncope-has had orthostatics done with another provider that he says did show drop suddenly of SBP with standing.  Denies any recent syncopal episodes but admits to near syncope.  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  I believe one of the major underlying causes is his anxiety that is contributing to his elevated BP readings.  Also instructed him about staying well-hydrated, wearing compression stockings, and good electrolyte replacement.  Instructed him to cut back on his alcohol use per week. We will bring him back in 2-3 weeks for a BP check and he will monitor and log his BP and bring this log sheet to this visit.  PCP has made adjustments to his BP medications.  Heart rate is slightly elevated today, more than likely due to anxiety.  Most recent echocardiogram benign. No medication changes at this time.  Blood pressure on recheck today was 160/95.  Agree with Dr. Alvan that we will accept high BP's for now. Care and ED precautions discussed.   Anxiety Continue BuSpar and continue to follow-up with psychiatry. Continue to follow with PCP.  Suspected sleep apnea Pauses on monitor as noted above happened at nighttime, no longer on Propranolol, admits to waking up in the middle of the night frequently, elevated BMI, will refer him to Pulmonology for OSA evaluation.   4. Tobacco use and alcohol use Encouraged him to cut back on alcohol use. Smoking cessation encouraged and discussed.    Dispo: Care and ED precautions discussed.  Follow-up with MD/APP in 6 weeks or sooner if any changes.  Signed, Almarie Crate, NP

## 2023-12-23 ENCOUNTER — Ambulatory Visit (INDEPENDENT_AMBULATORY_CARE_PROVIDER_SITE_OTHER): Admitting: Pulmonary Disease

## 2023-12-23 ENCOUNTER — Encounter: Payer: Self-pay | Admitting: Pulmonary Disease

## 2023-12-23 VITALS — BP 158/103 | HR 88 | Ht 74.0 in | Wt 227.2 lb

## 2023-12-23 DIAGNOSIS — G4733 Obstructive sleep apnea (adult) (pediatric): Secondary | ICD-10-CM | POA: Diagnosis not present

## 2023-12-23 DIAGNOSIS — R911 Solitary pulmonary nodule: Secondary | ICD-10-CM | POA: Diagnosis not present

## 2023-12-23 DIAGNOSIS — F17210 Nicotine dependence, cigarettes, uncomplicated: Secondary | ICD-10-CM | POA: Diagnosis not present

## 2023-12-23 DIAGNOSIS — J4540 Moderate persistent asthma, uncomplicated: Secondary | ICD-10-CM

## 2023-12-23 DIAGNOSIS — Z23 Encounter for immunization: Secondary | ICD-10-CM

## 2023-12-23 MED ORDER — VENTOLIN HFA 108 (90 BASE) MCG/ACT IN AERS: 2.0000 | INHALATION_SPRAY | Freq: Two times a day (BID) | RESPIRATORY_TRACT | 6 refills | Status: AC

## 2023-12-23 MED ORDER — SPIRIVA RESPIMAT 2.5 MCG/ACT IN AERS: 2.0000 | INHALATION_SPRAY | Freq: Every day | RESPIRATORY_TRACT | Status: AC

## 2023-12-23 MED ORDER — SPIRIVA RESPIMAT 2.5 MCG/ACT IN AERS
2.0000 | INHALATION_SPRAY | Freq: Every day | RESPIRATORY_TRACT | 6 refills | Status: AC
Start: 2023-12-23 — End: ?

## 2023-12-23 NOTE — Progress Notes (Signed)
 New Patient Pulmonology Office Visit   Subjective:  Patient ID: [name], male    DOB: 03-02-1965  MRN: [id number]  Referred by: Maree Isles, MD  CC:  Chief Complaint  Patient presents with   Establish Care    Falls asleep during the day - stops breathing at night -  Shob - on 2 lpm hs -     HPI [name] is a 58 y.o. male with hx of anxiety and hypertension who presents for initial evaluation of sleep disordered breathing.  Discussed the use of AI scribe software for clinical note transcription with the patient, who gave verbal consent to proceed.  History of Present Illness [name] is a 58 year old male with lung nodules and orthostatic hypotension who presents with shortness of breath and sleep disturbances.  He experiences significant shortness of breath and sleep disturbances, with episodes of gasping for air occurring both during the day and at night, sometimes accompanied by a choking or gurgling sensation. He uses oxygen at night, and his oxygen levels have been observed to drop into the seventies. 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  He has a history of lung nodules, with a CT scan showing a stable nodule in the left lower lobe since 2022. He also has orthostatic hypotension and pauses in his heart rhythm, with a Holter monitor recording 119 pauses over two weeks. He was recently hospitalized for a subdural hematoma following a fall, which has affected his memory.  He uses Symbicort (two puffs in the morning and evening) and Ventolin  as needed for respiratory symptoms. Ventolin  helps him regain his breath during episodes of shortness of breath. He also uses albuterol  and has been on prednisone within the last year for respiratory issues.  He has allergies to dust, grass, pollen, and animal dander, which exacerbate his respiratory symptoms. Strong  smells can irritate his nose and lungs, causing a burning sensation or runny nose. His family history includes sleep apnea in his sister and COPD in his mother. He has a history of smoking, having smoked a pack a day for about 30 years, but he has quit smoking recently.  The Epworth Sleepiness score is 16/24.   STOPBANG: Snoring Loudly, Fatigue, Observed Apneas, Hypertension, Age > 95, and Male Gender 6/8     12/23/2023    3:00 PM  Results of the Epworth flowsheet  Sitting and reading 3  Watching TV 3  Sitting, inactive in a public place (e.g. a theatre or a meeting) 1  As a passenger in a car for an hour without a break 3  Lying down to rest in the afternoon when circumstances permit 3  Sitting and talking to someone 1  Sitting quietly after a lunch without alcohol 2  In a car, while stopped for a few minutes in traffic 0  Total score 16    ROS  Allergies: Penicillins and Hydrocodone   Current Outpatient Medications:    amLODipine  (NORVASC ) 2.5 MG tablet, Take 1 tablet (2.5 mg total) by mouth daily. (Patient taking differently: Take 5 mg by mouth daily.), Disp: 30 tablet, Rfl: 2   B Complex Vitamins (B COMPLEX PO), Take 1 tablet by mouth daily., Disp: , Rfl:    BIOTIN PO, Take 1 tablet by mouth daily., Disp: , Rfl:    busPIRone  (BUSPAR ) 15 MG tablet, Take 10-15 mg by mouth See admin instructions. Take  10mg  by mouth in the morning and 15mg  in the afternoon., Disp: , Rfl:    butalbital -acetaminophen -caffeine  (FIORICET ) 50-325-40 MG tablet, Take 1 tablet by mouth every 6 (six) hours as needed for headache., Disp: 14 tablet, Rfl: 0   feeding supplement (ENSURE PLUS HIGH PROTEIN) LIQD, Take 237 mLs by mouth 3 (three) times daily between meals for 15 days., Disp: 10665 mL, Rfl: 0   FLUoxetine  HCl 60 MG TABS, Take 60 mg by mouth daily., Disp: , Rfl:    fluticasone (FLONASE) 50 MCG/ACT nasal spray, Place 1 spray into both nostrils daily as needed for allergies or rhinitis., Disp: , Rfl:     gabapentin (NEURONTIN) 100 MG capsule, Take 100-300 mg by mouth at bedtime as needed (Pain)., Disp: , Rfl:    metoCLOPramide  (REGLAN ) 5 MG tablet, Take 1 tablet (5 mg total) by mouth daily as needed for nausea or vomiting (with migraine)., Disp: 20 tablet, Rfl: 0   naproxen  (NAPROSYN ) 500 MG tablet, Take 1 tablet (500 mg total) by mouth daily as needed for headache (with migraine)., Disp: 20 tablet, Rfl: 0   Nicotine 21-14-7 MG/24HR KIT, Apply 1 kit topically daily., Disp: , Rfl:    omeprazole (PRILOSEC) 20 MG capsule, Take 20 mg by mouth daily as needed (Indigestion)., Disp: , Rfl:    ondansetron  (ZOFRAN ) 4 MG tablet, Take 1 tablet (4 mg total) by mouth every 6 (six) hours as needed for nausea., Disp: 20 tablet, Rfl: 0   oxyCODONE  (OXY IR/ROXICODONE ) 5 MG immediate release tablet, Take 1 tablet (5 mg total) by mouth every 6 (six) hours as needed for moderate pain (pain score 4-6) or severe pain (pain score 7-10) (5mg  for moderate pain, 10mg  for severe pain)., Disp: 20 tablet, Rfl: 0   polyethylene glycol powder (GLYCOLAX /MIRALAX ) 17 GM/SCOOP powder, Take 17 g by mouth daily as needed for moderate constipation. Dissolve 1 capful (17g) in 4-8 ounces of liquid and take by mouth daily as needed for moderate constipation, Disp: 238 g, Rfl: 0   QUEtiapine  (SEROQUEL ) 50 MG tablet, Take 50 mg by mouth at bedtime., Disp: , Rfl:    sildenafil (REVATIO) 20 MG tablet, Take 20 mg by mouth daily as needed (ED)., Disp: , Rfl:    SUMAtriptan  (IMITREX ) 50 MG tablet, Take 1 tablet (50 mg total) by mouth once as needed for migraine. May repeat in 2 hours if headache persists or recurs. Max: 200mg  per 24 hours, Disp: 30 tablet, Rfl: 2   SYMBICORT 160-4.5 MCG/ACT inhaler, Inhale 2 puffs into the lungs 2 (two) times daily., Disp: , Rfl:    Tiotropium Bromide (SPIRIVA  RESPIMAT) 2.5 MCG/ACT AERS, Inhale 2 puffs into the lungs daily., Disp: 1 g, Rfl: 6   Tiotropium Bromide (SPIRIVA  RESPIMAT) 2.5 MCG/ACT AERS, Inhale 2 puffs  into the lungs daily., Disp: , Rfl:    ALPRAZolam  (XANAX ) 1 MG tablet, Take 1 mg by mouth daily as needed for anxiety. (Patient not taking: Reported on 12/23/2023), Disp: , Rfl:    VENTOLIN  HFA 108 (90 Base) MCG/ACT inhaler, Inhale 2 puffs into the lungs 2 (two) times daily., Disp: 1 each, Rfl: 6 Past Medical History:  Diagnosis Date   Anxiety    Diverticulitis    H/O left nephrectomy    Hypertension    Past Surgical History:  Procedure Laterality Date   APPENDECTOMY     COLONOSCOPY  2019   COLONOSCOPY WITH PROPOFOL  N/A 09/26/2022   Procedure: COLONOSCOPY WITH PROPOFOL ;  Surgeon: Cinderella Deatrice FALCON, MD;  Location: [facility];  Service: Endoscopy;  Laterality: N/A;  9:15AM;ASA 2   POLYPECTOMY  09/26/2022   Procedure: POLYPECTOMY;  Surgeon: Cinderella Deatrice FALCON, MD;  Location: [facility];  Service: Endoscopy;;   sigmoid removal     Family History  Problem Relation Age of Onset   Hypertension Mother    Hypertension Father    Social History   Socioeconomic History   Marital status: Single    Spouse name: Not on file   Number of children: Not on file   Years of education: Not on file   Highest education level: Not on file  Occupational History   Not on file  Tobacco Use   Smoking status: Former    Current packs/day: 0.00    Average packs/day: 0.5 packs/day for 26.0 years (13.0 ttl pk-yrs)    Types: Cigarettes    Start date: 55    Quit date: 2025    Years since quitting: 0.9   Smokeless tobacco: Never  Vaping Use   Vaping status: Never Used  Substance and Sexual Activity   Alcohol use: Not Currently    Alcohol/week: 1.0 standard drink of alcohol    Types: 1 Cans of beer per week    Comment: occasionally   Drug use: Never   Sexual activity: Not Currently  Other Topics Concern   Not on file  Social History Narrative   Not on file   Social Drivers of Health   Financial Resource Strain: Not on file  Food Insecurity: Food Insecurity Present (12/02/2023)   Hunger  Vital Sign    Worried About Running Out of Food in the Last Year: Sometimes true    Ran Out of Food in the Last Year: Sometimes true  Transportation Needs: Unmet Transportation Needs (12/02/2023)   PRAPARE - Administrator, Civil Service (Medical): No    Lack of Transportation (Non-Medical): Yes  Physical Activity: Not on file  Stress: Not on file  Social Connections: Not on file  Intimate Partner Violence: Not At Risk (12/02/2023)   Humiliation, Afraid, Rape, and Kick questionnaire    Fear of Current or Ex-Partner: No    Emotionally Abused: No    Physically Abused: No    Sexually Abused: No       Objective:  BP (!) 158/103   Pulse 88   Ht 6' 2 (1.88 m)   Wt 227 lb 3.2 oz (103.1 kg)   SpO2 96% Comment: ra  BMI 29.17 kg/m  Wt Readings from Last 3 Encounters:  12/23/23 227 lb 3.2 oz (103.1 kg)  12/10/23 232 lb 9.4 oz (105.5 kg)  11/05/23 237 lb (107.5 kg)   BMI Readings from Last 3 Encounters:  12/23/23 29.17 kg/m  12/10/23 29.86 kg/m  11/05/23 30.43 kg/m   SpO2 Readings from Last 3 Encounters:  12/23/23 96%  12/12/23 96%  11/05/23 99%   Physical Exam General: NAD, alert, WD, WN Eyes: PERRL, no scleral icterus ENMT: oropharynx clear, good dentition, no oral lesions, mallampati score IV Skin: warm, intact, no rashes Neck: neck circumference 15.5 inches CV: RRR, no MRG, nl S1 and S2, no peripheral edema Resp: clear to auscultation bilaterally, no wheezes, rales, or rhonchi, normal effort, no clubbing/cyanosis Neuro: Awake alert oriented to person place time and situation  Diagnostic Review:  Last CBC Lab Results  Component Value Date   WBC 18.7 (H) 12/11/2023   HGB 14.6 12/11/2023   HCT 42.5 12/11/2023   MCV 99.5 12/11/2023   MCH 34.2 (H)  12/11/2023   RDW 12.1 12/11/2023   PLT 397 12/11/2023   Last metabolic panel Lab Results  Component Value Date   GLUCOSE 114 (H) 12/11/2023   NA 133 (L) 12/11/2023   K 4.2 12/11/2023   CL 100 12/11/2023    CO2 25 12/11/2023   BUN 16 12/11/2023   CREATININE 0.86 12/11/2023   GFRNONAA >60 12/11/2023   CALCIUM 9.0 12/11/2023   PHOS 3.8 12/10/2023   PROT 6.6 12/09/2023   ALBUMIN 3.0 (L) 12/09/2023   BILITOT 0.7 12/09/2023   ALKPHOS 68 12/09/2023   AST 23 12/09/2023   ALT 25 12/09/2023   ANIONGAP 8 12/11/2023   CT ABD/PLV 08/21/2004: I reviewed this CT independently and patient had the same left lower lobe subpleural soft tissue density that is present on current CT chest from 11/2023. This does not appear to be changed and is likely benign in etiology.  CT Chest 02/17/2020: IMPRESSION: 1. Patchy airspace disease in the posterior lungs probably due to contusion in the setting of trauma. Pneumonia would be another consideration. 2. Acute mildly displaced fractures of the left lateral tenth rib. 3. Acute mildly displaced fractures of the left transverse process of L1 and L2. 4. Lobular soft tissue mildly hyperdense structures in the left lateral chest wall and left posterior subpleural space. Possibly splenosis. No change since 2008. 5. 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 6. Aortic atherosclerosis. 7. No evidence of solid organ injury.  CT Chest 12/01/2023: LUNGS AND PLEURA: Mild emphysema present. Minimal patchy ground glass opacities in the left upper lobe. Lung base nodules in the lower left hemithorax measuring up to 2.5 cm appear unchanged. This was previously characterized as splenosis. No focal consolidation or pulmonary edema. No pleural effusion or pneumothorax.    Assessment & Plan:   Assessment & Plan Moderate persistent asthma without complication Moderate persistent asthma Symptoms of wheezing and shortness of breath. Current treatment includes Symbicort and albuterol . ACT 17 indicating sub-optimal control. Spiriva  added for better control. - Continue Symbicort two puffs in the morning and two puffs in the evening. - Added Spiriva  two puffs in the  morning. - Continue albuterol  as needed. - Provided samples of Spiriva . OSA (obstructive sleep apnea) Obstructive sleep apnea evaluation Suspected moderate to severe obstructive sleep apnea due to symptoms and nocturnal oxygen desaturation. The patient has symptoms c/w sleep apnea including daytime somnolence, fatigue, snoring, and apnea. ESS 16, STOP BANG 6, Mallampati Score IV, Neck Circumference is 15.5 inches. Home sleep test preferred for convenience. - Ordered home sleep test to evaluate for obstructive sleep apnea. Solitary pulmonary nodule Solitary pulmonary nodule, left lower lobe, stable Solitary pulmonary nodule in the left lower lobe, measuring 2.5 cm, unchanged since 2006. Likely benign. - Will order follow-up CT scan in 12 months to monitor nodule. Immunization due  Cigarette nicotine dependence without complication Smoking/Tobacco Cessation Counseling [name] is a current user of tobacco or nicotine products. He is considering quitting at this time. Counseling provided today addressed the risks of continued use and the benefits of cessation. Discussed tobacco/nicotine use history, readiness to quit, and evidence-based treatment options including behavioral strategies, support resources, and pharmacologic therapies. Provided encouragement and educational materials on steps and resources to quit smoking. Patient questions were addressed, and follow-up recommended for continued support. Total time spent on counseling: 4 minutes.   Orders Placed This Encounter  Procedures   CT Chest Wo Contrast   Pneumococcal conjugate vaccine 20-valent (Prevnar-20)   Pulmonary function test  Home sleep test   I spent 45 minutes reviewing patient's chart including prior consultant notes, imaging, and PFTs as well as face-to-face with the patient, over half in discussion of the diagnosis and the importance of compliance with the treatment plan.  Return in about 3 months (around  03/24/2024).   Natallia Stellmach, MD

## 2023-12-23 NOTE — Patient Instructions (Addendum)
  VISIT SUMMARY: During your visit, we discussed your shortness of breath, sleep disturbances, and several other health concerns. We have made adjustments to your asthma treatment, ordered a home sleep test, and planned follow-ups for your pulmonary nodule and subdural hematoma.  YOUR PLAN: OBSTRUCTIVE SLEEP APNEA EVALUATION: You have symptoms suggesting moderate to severe obstructive sleep apnea, including significant shortness of breath and nocturnal oxygen desaturation. -We have ordered a home sleep test to evaluate for obstructive sleep apnea.  MODERATE PERSISTENT ASTHMA: You have symptoms of wheezing and shortness of breath. Your current treatment includes Symbicort and albuterol . -Continue using Symbicort, two puffs in the morning and two puffs in the evening. -We have added Spiriva , two puffs in the morning, to your treatment plan. -Continue using albuterol  as needed. -We provided you with samples of Spiriva .  SOLITARY PULMONARY NODULE, LEFT LOWER LOBE, STABLE: You have a stable solitary pulmonary nodule in the left lower lobe, unchanged since 2022. -We will order a follow-up CT scan in six months to monitor the nodule.  SUBDURAL HEMATOMA, RECENT WITH MEMORY IMPAIRMENT: You have a recent subdural hematoma with associated memory impairment. -Ensure you follow up with neurology in January.  ORTHOSTATIC HYPOTENSION AND CARDIAC PAUSES: You have orthostatic hypotension and cardiac pauses, with symptoms including dizziness and falls. -Continue monitoring your blood pressure and symptoms.  HISTORY OF TOBACCO USE: You have a history of tobacco use and occasional use of cigarettes due to stress. -Try to avoid smoking to improve your overall health.  ALLERGIC RHINITIS: You have allergies to multiple allergens, causing symptoms like a runny nose and burning sensation with strong odors. -Avoid known allergens and irritants.  Contains text generated by [employer].

## 2023-12-30 ENCOUNTER — Telehealth: Payer: Self-pay | Admitting: Pulmonary Disease

## 2023-12-30 NOTE — Telephone Encounter (Signed)
 Copied from CRM [id number]. Topic: General - Other >> Dec 29, 2023  4:36 PM Rilla B wrote: Reason for CRM: Duwaine RAMAN, with [facility] [employer] regarding prior auth for sleep study. States [facility] was working on it.  Please call [phone].... ask for MPA Department.

## 2023-12-30 NOTE — Telephone Encounter (Signed)
 Megan from [facility] called again regarding the prior author put on the portal.  Her call back number is [phone].  Can speak to any nurse who answers.  Thanks.

## 2023-12-31 ENCOUNTER — Telehealth: Payer: Self-pay | Admitting: Cardiology

## 2023-12-31 NOTE — Telephone Encounter (Signed)
 Pt would like to switch from Dr. Alvan to Dr. Debera. Pts mother sees Dr. Debera as well. Please advise.

## 2023-12-31 NOTE — Telephone Encounter (Signed)
 Message routed to [facility].

## 2024-01-03 NOTE — Progress Notes (Deleted)
 Cardiology Office Note:  .   Date:  01/03/2024  ID:  [name], DOB 1965-04-05, MRN [id number] PCP: Daniel Isles, MD  [facility] Providers Cardiologist:  Daniel Carrier, MD {  History of Present Illness: Daniel   RICHARDSON Gay is a 58 y.o. male  with PMHx of Syncope/falls secondary to dysautonomia and chronic orthostatic hypotension, hypertension, diverticulitis, asthma, history of left nephrectomy, anxiety, tobacco/ETOH use who reports to [facility] office for follow up.   Syncope/falls secondary to dysautonomia and chronic orthostatic hypotension Echo 08/2023 with PCP showed LVEF 59%, grade 1 DD.  Zio 09/2023: NSR, avg HR 67, 1 episode of 4 beat SVT, 119 pauses with longest 5.2 seconds Echo 11/2023: EF 65-70%, mild LVH, G1DD, trivial MV regurgitation, dilated IVC  Last seen in heartcare 11/05/2023 by Daniel Crate, NP for 2 month follow up.  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  Also noted at home labile BPs with BP elevated in office. Noted recently starting BuSpar  for anxiety and noticed BP improves after meds.  Also admitted waking up frequently at night with what sounds like a panic attack.  Agreed with Dr. Alvan to except high BPs for now.  Continued on amlodipine  2.5 mg daily, lisinopril  20 mg daily.  Referred to pulmonary for OSA evaluation.   Recent hospitalization 12/01/14/2025 for fall secondary to dysautonomic and orthostatic hypotension, which is chronic. Also noted ETOH intoxication. Diagnosed with bilateral subdural hematoma (L>R), subarachnoid hemorrhage, nondisplaced C4 spinous process fracture, and right forehead scalp laceration and hematoma. Amlodipine  was reduced from 5 mg to 2.5 mg.   Today, reports ### and denies ###.  Denies chest pain, shortness of breath, palpitations, syncope, presyncope, dizziness, orthopnea, PND, swelling or significant weight changes, acute bleeding, or claudication.   Reports  compliance with medications.  Dietary habitats:  Activity level:  Social: Denies tobacco use/alcohol/drug use  Denies any recent hospitalizations or visits to the emergency department.   Syncope and collapse Orthostatic syncope Dysautonomia (HCC)  Encourage using compression stockings, staying hydrated, regular meals using salt tablets for load in, slow position changes and abdominal binder.  Essential hypertension, benign Reports well controlled Home BP:  BP this OV well controlled today:  Continue on / Managed by GDMT as above.  Encourage physical activity for 150 minutes per week and heart healthy low sodium diet. Discussed limiting sodium intake to < 2 grams daily.    Suspected sleep apnea Pauses on monitor as noted above happened at nighttime, no longer on Propranolol  Home Sleep study pending  Tobacco use Alcohol use    ROS: 10 point review of system has been reviewed and considered negative except ones been listed in the HPI.   Studies Reviewed: .   ZIO monitor 09/2023: Preliminary findings Patient had min heart rate of 32 bpm, max heart rate of 136 bpm, and average heart rate 67 bpm.  Predominant underlying rhythm was sinus rhythm.  Slight P wave morphology changes were noted.  1 run of SVT lasting 4 beats, max rate 136 bpm, 119 pauses occurred with the longest lasting 5.2 seconds.  Isolated SVE's were rare, along with rare SVE couplets and SVE triplets were rare.  Isolated VE's were rare along with VE couplets and no VE triplets were present.  Ventricular trigeminy was present.  ECHO IMPRESSIONS 12/02/2023  1. Left ventricular ejection fraction, by estimation, is 65 to 70%. The left ventricle has normal function. The left ventricle has no regional wall motion abnormalities. There  is mild asymmetric left ventricular hypertrophy of the septal segment. Left  ventricular diastolic parameters are consistent with Grade I diastolic dysfunction (impaired relaxation).   2. Right  ventricular systolic function is normal. The right ventricular size is normal. Tricuspid regurgitation signal is inadequate for assessing  PA pressure.   3. The mitral valve is normal in structure. Trivial mitral valve  regurgitation. No evidence of mitral stenosis.   4. The aortic valve is tricuspid. Aortic valve regurgitation is not  visualized. No aortic stenosis is present.   5. The inferior vena cava is dilated in size with >50% respiratory variability, suggesting right atrial pressure of 8 mmHg.  Risk Assessment/Calculations:   {Does this patient have ATRIAL FIBRILLATION?:[phone]} No BP recorded.  {Refresh Note OR Click here to enter BP  :1}***       Physical Exam:   VS:  There were no vitals taken for this visit.   Wt Readings from Last 3 Encounters:  12/23/23 227 lb 3.2 oz (103.1 kg)  12/10/23 232 lb 9.4 oz (105.5 kg)  11/05/23 237 lb (107.5 kg)    GEN: Well nourished, well developed in no acute distress while sitting in chair.  NECK: No JVD; No carotid bruits CARDIAC: ***RRR, no murmurs, rubs, gallops RESPIRATORY:  Clear to auscultation without rales, wheezing or rhonchi  ABDOMEN: Soft, non-tender, non-distended EXTREMITIES:  No edema; No deformity   ASSESSMENT AND PLAN: .   ***    {Are you ordering a CV Procedure (e.g. stress test, cath, DCCV, TEE, etc)?   Press F2        :[id number]}  Dispo: ***  Signed, Daniel CINDERELLA Kapur, PA-C

## 2024-01-04 ENCOUNTER — Ambulatory Visit: Payer: Self-pay | Admitting: Nurse Practitioner

## 2024-01-04 NOTE — Telephone Encounter (Unsigned)
 Copied from CRM [id number]. Topic: General - Other >> Dec 29, 2023  4:36 PM Rilla B wrote: Reason for CRM: Duwaine RAMAN, with [place] [employer] regarding prior auth for sleep study. States [facility] was working on it.  Please call [phone].... ask for MPA Department. >> Jan 04, 2024 12:13 PM Corean SAUNDERS wrote: Duwaine is requesting a call back from [facility] regarding the CPT code as she is concerned [facility] may have cancelled the incorrect one. Please call [phone]....  Can speak to any nurse or ask for [facility] directly .

## 2024-01-04 NOTE — Telephone Encounter (Signed)
 Megan from [employer] called again regarding the prior auth for sleep study.  Please return her call at [phone].... ask for MPA Department.   Thanks.

## 2024-01-06 ENCOUNTER — Emergency Department (HOSPITAL_COMMUNITY)
Admission: EM | Admit: 2024-01-06 | Discharge: 2024-01-06 | Disposition: A | Discharge status: Home / Self Care | Attending: Emergency Medicine | Admitting: Emergency Medicine

## 2024-01-06 ENCOUNTER — Other Ambulatory Visit: Payer: Self-pay

## 2024-01-06 ENCOUNTER — Encounter (HOSPITAL_COMMUNITY): Payer: Self-pay

## 2024-01-06 ENCOUNTER — Emergency Department (HOSPITAL_COMMUNITY)

## 2024-01-06 ENCOUNTER — Ambulatory Visit: Attending: Physician Assistant | Admitting: Physician Assistant

## 2024-01-06 DIAGNOSIS — Z720 Tobacco use: Secondary | ICD-10-CM

## 2024-01-06 DIAGNOSIS — Z79899 Other long term (current) drug therapy: Secondary | ICD-10-CM | POA: Diagnosis not present

## 2024-01-06 DIAGNOSIS — I10 Essential (primary) hypertension: Secondary | ICD-10-CM | POA: Insufficient documentation

## 2024-01-06 DIAGNOSIS — I159 Secondary hypertension, unspecified: Secondary | ICD-10-CM

## 2024-01-06 DIAGNOSIS — Y92009 Unspecified place in unspecified non-institutional (private) residence as the place of occurrence of the external cause: Secondary | ICD-10-CM | POA: Insufficient documentation

## 2024-01-06 DIAGNOSIS — I951 Orthostatic hypotension: Secondary | ICD-10-CM | POA: Insufficient documentation

## 2024-01-06 DIAGNOSIS — F1090 Alcohol use, unspecified, uncomplicated: Secondary | ICD-10-CM

## 2024-01-06 DIAGNOSIS — W19XXXA Unspecified fall, initial encounter: Secondary | ICD-10-CM | POA: Insufficient documentation

## 2024-01-06 DIAGNOSIS — G44309 Post-traumatic headache, unspecified, not intractable: Secondary | ICD-10-CM

## 2024-01-06 DIAGNOSIS — R29818 Other symptoms and signs involving the nervous system: Secondary | ICD-10-CM

## 2024-01-06 DIAGNOSIS — R519 Headache, unspecified: Secondary | ICD-10-CM | POA: Insufficient documentation

## 2024-01-06 DIAGNOSIS — M545 Low back pain, unspecified: Secondary | ICD-10-CM | POA: Insufficient documentation

## 2024-01-06 DIAGNOSIS — G901 Familial dysautonomia [Riley-Day]: Secondary | ICD-10-CM

## 2024-01-06 DIAGNOSIS — R55 Syncope and collapse: Secondary | ICD-10-CM

## 2024-01-06 LAB — CBC WITH DIFFERENTIAL/PLATELET
Abs Immature Granulocytes: 0.06 K/uL (ref 0.00–0.07)
Basophils Absolute: 0.1 K/uL (ref 0.0–0.1)
Basophils Relative: 1 %
Eosinophils Absolute: 0.3 K/uL (ref 0.0–0.5)
Eosinophils Relative: 3 %
HCT: 46.1 % (ref 39.0–52.0)
Hemoglobin: 15.5 g/dL (ref 13.0–17.0)
Immature Granulocytes: 1 %
Lymphocytes Relative: 31 %
Lymphs Abs: 3.6 K/uL (ref 0.7–4.0)
MCH: 33.6 pg (ref 26.0–34.0)
MCHC: 33.6 g/dL (ref 30.0–36.0)
MCV: 100 fL (ref 80.0–100.0)
Monocytes Absolute: 1.1 K/uL — ABNORMAL HIGH (ref 0.1–1.0)
Monocytes Relative: 10 %
Neutro Abs: 6.5 K/uL (ref 1.7–7.7)
Neutrophils Relative %: 54 %
Platelets: 290 K/uL (ref 150–400)
RBC: 4.61 MIL/uL (ref 4.22–5.81)
RDW: 12.6 % (ref 11.5–15.5)
WBC: 11.7 K/uL — ABNORMAL HIGH (ref 4.0–10.5)
nRBC: 0 % (ref 0.0–0.2)

## 2024-01-06 LAB — COMPREHENSIVE METABOLIC PANEL WITH GFR
ALT: 24 U/L (ref 0–44)
AST: 24 U/L (ref 15–41)
Albumin: 4.1 g/dL (ref 3.5–5.0)
Alkaline Phosphatase: 114 U/L (ref 38–126)
Anion gap: 13 (ref 5–15)
BUN: 13 mg/dL (ref 6–20)
CO2: 22 mmol/L (ref 22–32)
Calcium: 9.6 mg/dL (ref 8.9–10.3)
Chloride: 105 mmol/L (ref 98–111)
Creatinine, Ser: 0.84 mg/dL (ref 0.61–1.24)
GFR, Estimated: >60 mL/min (ref >60)
Glucose, Bld: 93 mg/dL (ref 70–99)
Potassium: 4.2 mmol/L (ref 3.5–5.1)
Sodium: 140 mmol/L (ref 135–145)
Total Bilirubin: 0.5 mg/dL (ref 0.0–1.2)
Total Protein: 7.3 g/dL (ref 6.5–8.1)

## 2024-01-06 LAB — TROPONIN T, HIGH SENSITIVITY: Troponin T High Sensitivity: <15 ng/L (ref 0–19)

## 2024-01-06 MED ORDER — ONDANSETRON HCL 4 MG/2ML IJ SOLN
4.0000 mg | Freq: Once | INTRAMUSCULAR | Status: AC
  Administered 2024-01-06: 4 mg via INTRAVENOUS
  Filled 2024-01-06: qty 2

## 2024-01-06 MED ORDER — METOCLOPRAMIDE HCL 5 MG/ML IJ SOLN
10.0000 mg | Freq: Once | INTRAMUSCULAR | Status: AC
  Administered 2024-01-06: 10 mg via INTRAVENOUS
  Filled 2024-01-06: qty 2

## 2024-01-06 MED ORDER — DIPHENHYDRAMINE HCL 50 MG/ML IJ SOLN
25.0000 mg | Freq: Once | INTRAMUSCULAR | Status: AC
  Administered 2024-01-06: 25 mg via INTRAVENOUS
  Filled 2024-01-06: qty 1

## 2024-01-06 MED ORDER — KETOROLAC TROMETHAMINE 15 MG/ML IJ SOLN: 15.0000 mg | Freq: Once | INTRAMUSCULAR | Status: DC

## 2024-01-06 MED ORDER — ONDANSETRON HCL 4 MG PO TABS: 4.0000 mg | ORAL_TABLET | Freq: Four times a day (QID) | ORAL | 0 refills | Status: AC | PRN

## 2024-01-06 MED ORDER — OXYCODONE HCL 5 MG PO TABS: 5.0000 mg | ORAL_TABLET | Freq: Four times a day (QID) | ORAL | 0 refills | Status: AC | PRN

## 2024-01-06 MED ORDER — HYDROMORPHONE HCL 1 MG/ML IJ SOLN
0.5000 mg | Freq: Once | INTRAMUSCULAR | Status: AC
  Administered 2024-01-06: 0.5 mg via INTRAVENOUS
  Filled 2024-01-06: qty 0.5

## 2024-01-06 MED ORDER — METHOCARBAMOL 500 MG PO TABS: 500.0000 mg | ORAL_TABLET | Freq: Four times a day (QID) | ORAL | 0 refills | Status: AC | PRN

## 2024-01-06 MED ORDER — MORPHINE SULFATE (PF) 4 MG/ML IV SOLN
4.0000 mg | Freq: Once | INTRAVENOUS | Status: DC
  Filled 2024-01-06: qty 1

## 2024-01-06 MED ORDER — METOCLOPRAMIDE HCL 5 MG PO TABS: 5.0000 mg | ORAL_TABLET | Freq: Every day | ORAL | 0 refills | Status: AC | PRN

## 2024-01-06 MED ORDER — OXYCODONE HCL 5 MG PO TABS
10.0000 mg | ORAL_TABLET | Freq: Once | ORAL | Status: AC
  Administered 2024-01-06: 10 mg via ORAL
  Filled 2024-01-06: qty 2

## 2024-01-06 NOTE — ED Provider Notes (Signed)
 [place] EMERGENCY DEPARTMENT AT [facility] Provider Note   CSN: [id number] Arrival date & time: 01/06/24  1324     Patient presents with: Hypertension   [name] is a 58 y.o. male.  He has history of splenectomy, diverticulitis, anxiety, left nephrectomy, had subdural hematoma after fall on 11//2025.  Patient's had some continued headaches and has not been able to follow-up with neurology due to lack of appointment availability.  He states he had a fall due to orthostatic hypotension at home 2 days ago and hit the right side of his back on the toilet is having pain since then that has gradually worsened.  He has been taking his home oxycodone  without relief.  Denies saddle anesthesia or paresthesia, denies bowel or bladder incontinence.  He is concerned today because his blood pressures have been very high up to 190s systolic.  He did take his amlodipine  this morning.  Wife helps with history, as patient has has had some memory issues since his subdural hematoma which is unchanged at this time.  He is not having any numbness tingling or weakness or vision changes.  He denies hitting his head when he fell.  No neck pain.  He has been ambulatory.  Still having consistent headaches, took all of his sumatriptan  and since discharge.  He denies numbness ting or weakness, vision changes or interval head injury.    Hypertension       Prior to Admission medications   Medication Sig Start Date End Date Taking? Authorizing Provider  ALPRAZolam  (XANAX ) 1 MG tablet Take 1 mg by mouth daily as needed for anxiety. Patient not taking: Reported on 12/23/2023 11/18/23   [provider]  amLODipine  (NORVASC ) 2.5 MG tablet Take 1 tablet (2.5 mg total) by mouth daily. Patient taking differently: Take 5 mg by mouth daily. 12/12/23 03/11/24  Pokhrel, Laxman, MD  B Complex Vitamins (B COMPLEX PO) Take 1 tablet by mouth daily.    [provider]  BIOTIN PO Take 1 tablet  by mouth daily.    [provider]  busPIRone  (BUSPAR ) 15 MG tablet Take 10-15 mg by mouth See admin instructions. Take 10mg  by mouth in the morning and 15mg  in the afternoon. 11/18/23   [provider]  butalbital -acetaminophen -caffeine  (FIORICET ) 50-325-40 MG tablet Take 1 tablet by mouth every 6 (six) hours as needed for headache. 12/12/23   Pokhrel, Laxman, MD  FLUoxetine  HCl 60 MG TABS Take 60 mg by mouth daily. 09/24/23   [provider]  fluticasone (FLONASE) 50 MCG/ACT nasal spray Place 1 spray into both nostrils daily as needed for allergies or rhinitis. 08/19/22   [provider]  gabapentin (NEURONTIN) 300 MG capsule Take 300 mg by mouth 2 (two) times daily. 12/18/23   [provider]  metoCLOPramide  (REGLAN ) 5 MG tablet Take 1 tablet (5 mg total) by mouth daily as needed for nausea or vomiting (with migraine). 12/12/23   Pokhrel, Laxman, MD  naproxen  (NAPROSYN ) 500 MG tablet Take 1 tablet (500 mg total) by mouth daily as needed for headache (with migraine). 12/12/23   Pokhrel, Laxman, MD  Nicotine 21-14-7 MG/24HR KIT Apply 1 kit topically daily. 10/16/23   [provider]  omeprazole (PRILOSEC) 20 MG capsule Take 20 mg by mouth daily as needed (Indigestion). 08/24/23   [provider]  ondansetron  (ZOFRAN ) 4 MG tablet Take 1 tablet (4 mg total) by mouth every 6 (six) hours as needed for nausea. 12/12/23   Pokhrel, Laxman, MD  oxyCODONE  (OXY IR/ROXICODONE ) 5 MG immediate release tablet Take 1 tablet (5 mg total) by mouth every 6 (six) hours as needed for moderate pain (pain score 4-6) or severe pain (pain score 7-10) (5mg  for moderate pain, 10mg  for severe pain). 12/12/23   Pokhrel, Laxman, MD  polyethylene glycol powder (GLYCOLAX /MIRALAX ) 17 GM/SCOOP powder Take 17 g by mouth daily as needed for moderate constipation. Dissolve 1 capful (17g) in 4-8 ounces of liquid and take by mouth daily as needed for moderate constipation 12/12/23    Pokhrel, Laxman, MD  QUEtiapine  (SEROQUEL ) 50 MG tablet Take 50 mg by mouth at bedtime. 11/22/23   [provider]  sildenafil (REVATIO) 20 MG tablet Take 20 mg by mouth daily as needed (ED). 08/08/22   [provider]  SUMAtriptan  (IMITREX ) 50 MG tablet Take 1 tablet (50 mg total) by mouth once as needed for migraine. May repeat in 2 hours if headache persists or recurs. Max: 200mg  per 24 hours 12/12/23 12/12/24  Pokhrel, Laxman, MD  SYMBICORT 160-4.5 MCG/ACT inhaler Inhale 2 puffs into the lungs 2 (two) times daily. 07/14/22   [provider]  Tiotropium Bromide (SPIRIVA  RESPIMAT) 2.5 MCG/ACT AERS Inhale 2 puffs into the lungs daily. 12/23/23   Alghanim, Fahid, MD  Tiotropium Bromide (SPIRIVA  RESPIMAT) 2.5 MCG/ACT AERS Inhale 2 puffs into the lungs daily. 12/23/23   Alghanim, Paula, MD  VENTOLIN  HFA 108 (90 Base) MCG/ACT inhaler Inhale 2 puffs into the lungs 2 (two) times daily. 12/23/23   Alghanim, Paula, MD    Allergies: Penicillins and Hydrocodone     Review of Systems  Updated Vital Signs BP (!) 175/86 (BP Location: Right Arm)   Pulse 66   Temp 98.1 F (36.7 C) (Oral)   Resp 16   Ht 6' 2 (1.88 m)   Wt 102.1 kg   SpO2 96%   BMI 28.89 kg/m   Physical Exam Vitals and nursing note reviewed.  Constitutional:      General: He is not in acute distress.    Appearance: He is well-developed.  HENT:     Head: Normocephalic and atraumatic.     Right Ear: Tympanic membrane normal.     Left Ear: Tympanic membrane normal.     Nose: Nose normal.     Mouth/Throat:     Mouth: Mucous membranes are moist.  Eyes:     Extraocular Movements: Extraocular movements intact.     Conjunctiva/sclera: Conjunctivae normal.     Pupils: Pupils are equal, round, and reactive to light.  Cardiovascular:     Rate and Rhythm: Normal rate and regular rhythm.     Heart sounds: No murmur heard. Pulmonary:     Effort: Pulmonary effort is normal. No respiratory distress.     Breath  sounds: Normal breath sounds.  Abdominal:     Palpations: Abdomen is soft.     Tenderness: There is no abdominal tenderness.  Musculoskeletal:        General: No swelling. Normal range of motion.     Cervical back: Neck supple.     Comments: Tenderness right lumbar area, no midline C-spine, T-spine or L-spine tenderness  Skin:    General: Skin is warm and dry.     Capillary Refill: Capillary refill takes less than 2 seconds.  Neurological:     General: No focal deficit present.     Mental Status: He is alert and oriented to person, place, and time.     Sensory: No sensory deficit.  Motor: No weakness.     Coordination: Coordination normal.     Gait: Gait normal.  Psychiatric:        Mood and Affect: Mood normal.        Behavior: Behavior normal.     (all labs ordered are listed, but only abnormal results are displayed) Labs Reviewed  CBC WITH DIFFERENTIAL/PLATELET - Abnormal; Notable for the following components:      Result Value   WBC 11.7 (*)    Monocytes Absolute 1.1 (*)    All other components within normal limits  COMPREHENSIVE METABOLIC PANEL WITH GFR  TROPONIN T, HIGH SENSITIVITY  TROPONIN T, HIGH SENSITIVITY    EKG: None  Radiology: No results found.   Procedures   Medications Ordered in the ED  morphine  (PF) 4 MG/ML injection 4 mg (has no administration in time range)                                    Medical Decision Making This patient presents to the ED for concern of low back pain after fall several days ago and headache, this involves an extensive number of treatment options, and is a complaint that carries with it a high risk of complications and morbidity.  The differential diagnosis includes fracture, sprain, strain, contusion, dislocation, concussion, intracranial hemorrhage, other   Co morbidities that complicate the patient evaluation :   Previous intracranial hemorrhage   Additional history obtained:  Additional history obtained  from EMR External records from outside source obtained and reviewed including recent notes from hospitalization for intracranial hemorrhage.  Was able to see discharge medications and instructions   Lab Tests:  I Ordered, and personally interpreted labs.  The pertinent results include: Labs reassuring CBC CMP and troponin ordered at triage   Imaging Studies ordered:  I ordered imaging studies including CT head which shows interval improvement of prior intracranial hemorrhage, subdural hemorrhages appear to be I independently visualized and interpreted imaging within scope of identifying emergent findings  I agree with the radiologist interpretation     Problem List / ED Course / Critical interventions / Medication management  Fall-patient had a fall several days ago, he has orthostatic hypotension, stood up too quickly to walk to the bathroom and fell down but did not lose consciousness and denies head injury.  Is having right lower back pain.  X-ray of his low back shows no fracture or traumatic malalignment.  He has no saddle esthesia or paresthesia or other red flag symptoms, no bowel or bladder incontinence.  He had been taking oxycodone  at home for his pain but ran out since discharge, he states that the 5 mg was not helpful so he was taking the 10 mg prescribed.  He is still waiting to get into our neurology and PCP for a follow-up.  He is worried today that his blood pressure was elevated, but he is acutely in pain.  Blood pressure was initially elevated after pain control his hypertension is resolved.  He is having chest pain or shortness of breath or dizziness and has had no interval falls.  He was worried about his continued headaches since his recent TBI, so repeat head CT was ordered and shows stable imaging.  I read the report to the patient and his wife and explained the findings.  They are waiting to get into neurology for follow-up.  He has continued to have headaches.  He  states  he ran out of his Imitrex .  Records show he has 2 refills, he was given Reglan  and Benadryl  in the ED with full resolution of his headache.  He had no numbness ting or weakness, no focal neurologic deficits I do not feel he needs any further imaging or workup at this time.  At his request I did prescribe a limited quantity of oxycodone  for his pain, and refill his nausea medications and his Robaxin  which was helping his discomfort at home as well.  He was advised on strict follow-up and return precautions, was agreed with plan of care and discharge. I ordered medication including Dilaudid  for low back pain, Reglan  and Benadryl  for headache Reevaluation of the patient after these medicines showed that the patient improved I have reviewed the patients home medicines and have made adjustments as needed       Amount and/or Complexity of Data Reviewed Radiology: ordered.  Risk Prescription drug management.        Final diagnoses:  None    ED Discharge Orders     None          Suellen Sherran DELENA DEVONNA 01/07/24 [id number]    Freddi Hamilton, MD 01/09/24 [phone]

## 2024-01-06 NOTE — Discharge Instructions (Addendum)
 Seen in the ER today for continued headaches and low back pain after recent fall, elevated blood pressure.  Fortunately her blood pressure improved after controlling her pain.  X-ray of her low back did not show any fractures, you likely have a muscle strain.  You can take the Robaxin .  Prescribing a small amount of oxycodone  to help with your pain that is more severe.  Be sure to not overtake this as this can cause dependence, constipation, drowsiness. I am also refilling your Reglan  and Zofran  from the hospital. Fortunately your CT scan showed no new bleeding.  You follow-up with your PCP and cardiologist about your blood pressure and with neurology about your headaches.  Come back to the ER for new or worsening symptoms.

## 2024-01-06 NOTE — ED Triage Notes (Signed)
 Pt arrived via POV c/o on-going headache, hypertension and reports back pain from a fall at home a couple of days ago. Pt also reports memory problems.

## 2024-01-07 ENCOUNTER — Other Ambulatory Visit: Payer: Self-pay

## 2024-01-08 ENCOUNTER — Ambulatory Visit: Payer: Self-pay | Admitting: Pulmonary Disease

## 2024-01-08 NOTE — Telephone Encounter (Signed)
 FYI Only or Action Required?: Action required by provider: clinical question for provider and update on patient condition.  Patient is followed in Pulmonology for asthma, last seen on 12/23/2023 by Catherine Cools, MD.  Called Nurse Triage reporting Shortness of Breath.  Symptoms began a week ago.  Interventions attempted: Rescue inhaler, Maintenance inhaler, and Home oxygen use.  Symptoms are: unchanged.  Triage Disposition: Call PCP Now  Patient/caregiver understands and will follow disposition?: Yes  Copied from CRM #[id number]. Topic: Clinical - Medical Advice >> Jan 08, 2024  4:48 PM Lavanda D wrote: Reason for CRM: Patient's wife would like to call and advise that Mr. Myhand has had an event a few weeks ago where he DeSatted and she had to turn up his oxygen to get him back to normal. She was a bit scared by this situation, and was unsure what to do in this event. She wonders if he should go to the hospital if this situation occurs again. >> Jan 08, 2024  4:52 PM Lavanda D wrote: Patients wife would also like to advise that he would like a sooner appt than March if possible - Pt not having any red word symptoms at the time of the call.  Reason for Disposition  Oxygen level (e.g., pulse oximetry) 91 to 94%  Answer Assessment - Initial Assessment Questions Patient with dysautonomia and has had multiple falls.  Recently diagnosed with orthostatic hypotension and had subdural hematoma from recent fall. He is now on home oxygent.   Patient de saturated into 70's about a weeks ago.  She applied oxygen at 5L to bring him up  Patient's wife needs guidelines about what to do when patient desats.  Specifically, how much oxygen by [place] can she give and for what oxygen saturation before she needs to activates EMS   3. OXYGEN THERAPY:      Oxygen at 2L while sleeping 4.  OXYGEN EQUIPMENT:  Are you having any trouble with your oxygen equipment?  (e.g., cannula, mask, tubing, tank,  concentrator)     Nasal cannula 5. OXYGEN SATURATION MONITOR:       Home pulse oximeter 6. OXYGEN LEVEL: What is your reading (oxygen level) today? What is your usual oxygen saturation reading? (e.g., 95%)     90-92% is patient's baseline 8. BREATHING DIFFICULTY: Are you having any difficulty breathing? If Yes, ask: How bad is it?  (e.g., none, mild, moderate, severe)      Does not appear or feel short of breath, but does get dizzy  Protocols used: Oxygen Monitoring and Hypoxia-A-AH

## 2024-01-08 NOTE — Telephone Encounter (Signed)
 Pt wife returning call. Please advise.

## 2024-01-11 ENCOUNTER — Encounter: Payer: Self-pay | Admitting: *Deleted

## 2024-01-11 NOTE — Telephone Encounter (Signed)
 Copied from CRM #[id number]. Topic: Referral - Question >> Jan 08, 2024  4:53 PM Lavanda D wrote: Reason for CRM: Patient's wife is calling because he currently has a HST @ at [facility] has not yet heard back - Just as long as [facility] also does HST's she would like it sent there instead.  LVM for patient to call and discuss

## 2024-01-11 NOTE — Telephone Encounter (Signed)
 Copied from CRM #[id number]. Topic: Appointments - Scheduling Inquiry for Clinic >> Jan 08, 2024  4:55 PM Abigail D wrote: Reason for CRM: Patient's wife would like to get her husband in sooner than current appt in March due to the following: Mr. Elena has had an event a few weeks ago where he DeSatted and she had to turn up his oxygen to get him back to normal. She was a bit scared by this situation, and was unsure what to do in this event. She said he is not currently having any red word symptoms but she would like to see Dr. DELENA as soon as possible.  LVM for patient to call and discuss scheduling

## 2024-01-12 NOTE — Telephone Encounter (Signed)
 LVM for patient to call and discuss scheduling

## 2024-01-14 NOTE — Telephone Encounter (Signed)
 LVM for patient to call and discuss scheduling sooner appointment with Dr. Catherine

## 2024-01-26 NOTE — Telephone Encounter (Signed)
 LVM for patient to call and discuss sooner appointment with Dr. Catherine and the HST that was ordered

## 2024-02-02 NOTE — Telephone Encounter (Signed)
 Spoke with [name] and offered to schedule appointment with Dr., Alghanim on 02/03/24 or 02/04/24.  She declined stating [name] has a traumatic head injury and there is a lot gong on right now.  Patient is scheduled for 03/09/24 at 11:15 am to see Dr. Catherine at which time we will do the HST as well as the office visit.  [name] states she will call if anything changes with their situation

## 2024-02-17 ENCOUNTER — Telehealth: Payer: Self-pay | Admitting: Diagnostic Neuroimaging

## 2024-02-17 NOTE — Telephone Encounter (Signed)
 Patient's wife cancelled appointment due to patient has been seen by someone else.

## 2024-02-18 ENCOUNTER — Ambulatory Visit: Admitting: Diagnostic Neuroimaging

## 2024-02-23 ENCOUNTER — Ambulatory Visit (HOSPITAL_COMMUNITY)

## 2024-03-02 ENCOUNTER — Ambulatory Visit (HOSPITAL_COMMUNITY): Admission: RE | Admit: 2024-03-02 | Source: Ambulatory Visit

## 2024-03-09 ENCOUNTER — Ambulatory Visit: Admitting: Pulmonary Disease

## 2024-04-05 ENCOUNTER — Ambulatory Visit: Admitting: Pulmonary Disease

## 2024-09-28 ENCOUNTER — Other Ambulatory Visit

## 2024-10-05 ENCOUNTER — Ambulatory Visit: Admitting: Oncology
